# Patient Record
Sex: Female | Born: 1950 | Race: Black or African American | Hispanic: No | Smoking: Former smoker
Health system: Southern US, Community
[De-identification: ages and names within clinical notes are randomized; demographics above are authoritative.]

## PROBLEM LIST (undated history)

## (undated) DIAGNOSIS — E785 Hyperlipidemia, unspecified: Secondary | ICD-10-CM

## (undated) DIAGNOSIS — I10 Essential (primary) hypertension: Secondary | ICD-10-CM

## (undated) HISTORY — DX: Hyperlipidemia, unspecified: E78.5

## (undated) HISTORY — DX: Essential (primary) hypertension: I10

## (undated) HISTORY — PX: TUBAL LIGATION: SHX77

## (undated) HISTORY — PX: ABDOMINAL HYSTERECTOMY: SHX81

---

## 2004-07-22 ENCOUNTER — Ambulatory Visit: Payer: Self-pay

## 2004-07-25 ENCOUNTER — Ambulatory Visit: Payer: Self-pay

## 2004-07-25 DIAGNOSIS — Z860100 Personal history of colon polyps, unspecified: Secondary | ICD-10-CM | POA: Insufficient documentation

## 2005-07-14 ENCOUNTER — Ambulatory Visit: Payer: Self-pay

## 2005-07-29 ENCOUNTER — Ambulatory Visit: Payer: Self-pay

## 2005-11-09 ENCOUNTER — Other Ambulatory Visit: Payer: Self-pay

## 2005-11-09 ENCOUNTER — Emergency Department: Payer: Self-pay | Admitting: Emergency Medicine

## 2006-09-26 ENCOUNTER — Emergency Department (HOSPITAL_COMMUNITY): Admission: EM | Admit: 2006-09-26 | Discharge: 2006-09-26 | Payer: Self-pay | Admitting: Emergency Medicine

## 2006-10-19 ENCOUNTER — Emergency Department (HOSPITAL_COMMUNITY): Admission: EM | Admit: 2006-10-19 | Discharge: 2006-10-19 | Payer: Self-pay | Admitting: Pediatrics

## 2006-10-26 ENCOUNTER — Ambulatory Visit: Payer: Self-pay | Admitting: Internal Medicine

## 2006-10-27 ENCOUNTER — Ambulatory Visit: Payer: Self-pay | Admitting: *Deleted

## 2006-11-26 ENCOUNTER — Ambulatory Visit: Payer: Self-pay | Admitting: Internal Medicine

## 2006-12-10 ENCOUNTER — Ambulatory Visit (HOSPITAL_COMMUNITY): Admission: RE | Admit: 2006-12-10 | Discharge: 2006-12-10 | Payer: Self-pay | Admitting: Internal Medicine

## 2007-05-06 DIAGNOSIS — R42 Dizziness and giddiness: Secondary | ICD-10-CM | POA: Insufficient documentation

## 2007-05-06 DIAGNOSIS — E119 Type 2 diabetes mellitus without complications: Secondary | ICD-10-CM

## 2007-05-06 DIAGNOSIS — I10 Essential (primary) hypertension: Secondary | ICD-10-CM | POA: Insufficient documentation

## 2007-06-09 ENCOUNTER — Ambulatory Visit: Payer: Self-pay | Admitting: Internal Medicine

## 2007-06-09 DIAGNOSIS — E785 Hyperlipidemia, unspecified: Secondary | ICD-10-CM

## 2007-06-09 DIAGNOSIS — M76899 Other specified enthesopathies of unspecified lower limb, excluding foot: Secondary | ICD-10-CM

## 2007-06-09 DIAGNOSIS — M545 Low back pain: Secondary | ICD-10-CM | POA: Insufficient documentation

## 2007-06-16 ENCOUNTER — Ambulatory Visit: Payer: Self-pay | Admitting: Internal Medicine

## 2007-06-23 ENCOUNTER — Encounter (INDEPENDENT_AMBULATORY_CARE_PROVIDER_SITE_OTHER): Payer: Self-pay | Admitting: Internal Medicine

## 2007-06-23 LAB — CONVERTED CEMR LAB
HDL: 37 mg/dL — ABNORMAL LOW (ref >39)
LDL Cholesterol: 77 mg/dL (ref 0–99)
Total CHOL/HDL Ratio: 4.5
Triglycerides: 259 mg/dL — ABNORMAL HIGH (ref <150)
VLDL: 52 mg/dL — ABNORMAL HIGH (ref 0–40)

## 2007-07-29 ENCOUNTER — Emergency Department (HOSPITAL_COMMUNITY): Admission: EM | Admit: 2007-07-29 | Discharge: 2007-07-29 | Payer: Self-pay | Admitting: Emergency Medicine

## 2007-09-08 ENCOUNTER — Ambulatory Visit: Payer: Self-pay | Admitting: Internal Medicine

## 2007-09-08 LAB — CONVERTED CEMR LAB
Blood Glucose, Fingerstick: 173
Hgb A1c MFr Bld: 7.7 %

## 2007-11-07 ENCOUNTER — Ambulatory Visit: Payer: Self-pay | Admitting: Internal Medicine

## 2007-11-08 ENCOUNTER — Encounter (INDEPENDENT_AMBULATORY_CARE_PROVIDER_SITE_OTHER): Payer: Self-pay | Admitting: Internal Medicine

## 2007-11-08 LAB — CONVERTED CEMR LAB
HDL: 38 mg/dL — ABNORMAL LOW (ref >39)
LDL Cholesterol: 43 mg/dL (ref 0–99)
Triglycerides: 389 mg/dL — ABNORMAL HIGH (ref <150)

## 2007-12-13 ENCOUNTER — Ambulatory Visit (HOSPITAL_COMMUNITY): Admission: RE | Admit: 2007-12-13 | Discharge: 2007-12-13 | Payer: Self-pay | Admitting: Family Medicine

## 2007-12-13 ENCOUNTER — Encounter (INDEPENDENT_AMBULATORY_CARE_PROVIDER_SITE_OTHER): Payer: Self-pay | Admitting: Internal Medicine

## 2008-01-03 ENCOUNTER — Telehealth (INDEPENDENT_AMBULATORY_CARE_PROVIDER_SITE_OTHER): Payer: Self-pay | Admitting: Internal Medicine

## 2008-07-26 ENCOUNTER — Ambulatory Visit: Payer: Self-pay | Admitting: Internal Medicine

## 2008-07-26 LAB — CONVERTED CEMR LAB
Nitrite: NEGATIVE
Specific Gravity, Urine: <1.005
Urobilinogen, UA: 0.2

## 2008-07-31 ENCOUNTER — Ambulatory Visit: Payer: Self-pay | Admitting: Internal Medicine

## 2008-08-01 ENCOUNTER — Ambulatory Visit: Payer: Self-pay | Admitting: Internal Medicine

## 2008-08-01 LAB — CONVERTED CEMR LAB
ALT: 13 units/L (ref 0–35)
Albumin: 4.5 g/dL (ref 3.5–5.2)
Alkaline Phosphatase: 75 units/L (ref 39–117)
BUN: 14 mg/dL (ref 6–23)
Basophils Absolute: 0.1 10*3/uL (ref 0.0–0.1)
CO2: 24 meq/L (ref 19–32)
Chloride: 101 meq/L (ref 96–112)
Creatinine, Ser: 0.93 mg/dL (ref 0.40–1.20)
Eosinophils Relative: 2 % (ref 0–5)
Glucose, Bld: 162 mg/dL — ABNORMAL HIGH (ref 70–99)
HCT: 42.6 % (ref 36.0–46.0)
Hemoglobin: 13.3 g/dL (ref 12.0–15.0)
LDL Cholesterol: 95 mg/dL (ref 0–99)
MCHC: 31.2 g/dL (ref 30.0–36.0)
MCV: 95.9 fL (ref 78.0–100.0)
Microalb, Ur: 12.8 mg/dL — ABNORMAL HIGH (ref 0.00–1.89)
Monocytes Absolute: 0.7 10*3/uL (ref 0.1–1.0)
Neutrophils Relative %: 56 % (ref 43–77)
Platelets: 305 10*3/uL (ref 150–400)
Potassium: 4 meq/L (ref 3.5–5.3)
Total CHOL/HDL Ratio: 4.8
Total Protein: 8.1 g/dL (ref 6.0–8.3)
VLDL: 46 mg/dL — ABNORMAL HIGH (ref 0–40)
WBC: 9.3 10*3/uL (ref 4.0–10.5)

## 2008-08-07 ENCOUNTER — Encounter (INDEPENDENT_AMBULATORY_CARE_PROVIDER_SITE_OTHER): Payer: Self-pay | Admitting: Internal Medicine

## 2008-08-12 ENCOUNTER — Ambulatory Visit: Payer: Self-pay | Admitting: Vascular Surgery

## 2008-08-16 ENCOUNTER — Encounter (INDEPENDENT_AMBULATORY_CARE_PROVIDER_SITE_OTHER): Payer: Self-pay | Admitting: Internal Medicine

## 2008-12-18 ENCOUNTER — Encounter (INDEPENDENT_AMBULATORY_CARE_PROVIDER_SITE_OTHER): Payer: Self-pay | Admitting: Internal Medicine

## 2008-12-18 ENCOUNTER — Ambulatory Visit (HOSPITAL_COMMUNITY): Admission: RE | Admit: 2008-12-18 | Discharge: 2008-12-18 | Payer: Self-pay | Admitting: Internal Medicine

## 2009-04-29 ENCOUNTER — Ambulatory Visit: Payer: Self-pay | Admitting: Internal Medicine

## 2009-04-29 ENCOUNTER — Encounter: Payer: Self-pay | Admitting: Physician Assistant

## 2009-04-29 DIAGNOSIS — R209 Unspecified disturbances of skin sensation: Secondary | ICD-10-CM | POA: Insufficient documentation

## 2009-04-29 LAB — CONVERTED CEMR LAB
Albumin: 4.3 g/dL (ref 3.5–5.2)
BUN: 11 mg/dL (ref 6–23)
Basophils Absolute: 0 10*3/uL (ref 0.0–0.1)
Basophils Relative: 0 % (ref 0–1)
CO2: 24 meq/L (ref 19–32)
Calcium: 9.5 mg/dL (ref 8.4–10.5)
Creatinine, Ser: 0.99 mg/dL (ref 0.40–1.20)
Glucose, Bld: 128 mg/dL — ABNORMAL HIGH (ref 70–99)
HCT: 40.5 % (ref 36.0–46.0)
Hemoglobin: 12.7 g/dL (ref 12.0–15.0)
Lymphs Abs: 3.3 10*3/uL (ref 0.7–4.0)
Neutro Abs: 7.3 10*3/uL (ref 1.7–7.7)
Neutrophils Relative %: 64 % (ref 43–77)
Potassium: 4.4 meq/L (ref 3.5–5.3)
RBC: 4.25 M/uL (ref 3.87–5.11)
WBC: 11.4 10*3/uL — ABNORMAL HIGH (ref 4.0–10.5)

## 2009-05-01 ENCOUNTER — Encounter: Payer: Self-pay | Admitting: Physician Assistant

## 2009-05-03 ENCOUNTER — Encounter (INDEPENDENT_AMBULATORY_CARE_PROVIDER_SITE_OTHER): Payer: Self-pay | Admitting: Internal Medicine

## 2009-05-17 ENCOUNTER — Ambulatory Visit: Payer: Self-pay | Admitting: Internal Medicine

## 2009-05-17 DIAGNOSIS — R079 Chest pain, unspecified: Secondary | ICD-10-CM

## 2009-05-20 ENCOUNTER — Ambulatory Visit (HOSPITAL_COMMUNITY): Admission: RE | Admit: 2009-05-20 | Discharge: 2009-05-20 | Payer: Self-pay | Admitting: Internal Medicine

## 2009-06-04 ENCOUNTER — Ambulatory Visit: Payer: Self-pay | Admitting: Internal Medicine

## 2009-06-04 DIAGNOSIS — F329 Major depressive disorder, single episode, unspecified: Secondary | ICD-10-CM

## 2009-06-04 LAB — CONVERTED CEMR LAB: Blood Glucose, Fingerstick: 135

## 2009-06-05 ENCOUNTER — Encounter (INDEPENDENT_AMBULATORY_CARE_PROVIDER_SITE_OTHER): Payer: Self-pay | Admitting: Internal Medicine

## 2009-06-12 ENCOUNTER — Encounter (INDEPENDENT_AMBULATORY_CARE_PROVIDER_SITE_OTHER): Payer: Self-pay | Admitting: Internal Medicine

## 2009-06-12 ENCOUNTER — Ambulatory Visit (HOSPITAL_COMMUNITY): Admission: RE | Admit: 2009-06-12 | Discharge: 2009-06-12 | Payer: Self-pay | Admitting: Internal Medicine

## 2009-08-08 ENCOUNTER — Ambulatory Visit: Payer: Self-pay | Admitting: Internal Medicine

## 2009-08-08 DIAGNOSIS — M25569 Pain in unspecified knee: Secondary | ICD-10-CM

## 2009-08-08 LAB — CONVERTED CEMR LAB: Blood Glucose, Fingerstick: 151

## 2009-09-13 ENCOUNTER — Ambulatory Visit: Payer: Self-pay | Admitting: Internal Medicine

## 2009-09-13 LAB — CONVERTED CEMR LAB
ALT: 10 units/L (ref 0–35)
Albumin: 4.1 g/dL (ref 3.5–5.2)
BUN: 14 mg/dL (ref 6–23)
CO2: 27 meq/L (ref 19–32)
Calcium: 10 mg/dL (ref 8.4–10.5)
Chloride: 101 meq/L (ref 96–112)
Glucose, Bld: 142 mg/dL — ABNORMAL HIGH (ref 70–99)
Sodium: 139 meq/L (ref 135–145)
Total Bilirubin: 0.7 mg/dL (ref 0.3–1.2)
Total CHOL/HDL Ratio: 3.4
Total Protein: 7.4 g/dL (ref 6.0–8.3)
Triglycerides: 124 mg/dL (ref <150)
VLDL: 25 mg/dL (ref 0–40)

## 2009-09-17 ENCOUNTER — Ambulatory Visit: Payer: Self-pay | Admitting: Internal Medicine

## 2009-09-17 DIAGNOSIS — Z780 Asymptomatic menopausal state: Secondary | ICD-10-CM | POA: Insufficient documentation

## 2009-09-17 LAB — CONVERTED CEMR LAB
Bilirubin Urine: NEGATIVE
Blood Glucose, Fingerstick: 126
Glucose, Urine, Semiquant: NEGATIVE
Ketones, urine, test strip: NEGATIVE
Nitrite: NEGATIVE
OCCULT 1: NEGATIVE
OCCULT 2: NEGATIVE
OCCULT 3: NEGATIVE
Protein, U semiquant: NEGATIVE
Specific Gravity, Urine: 1.01
Urobilinogen, UA: 0.2
WBC Urine, dipstick: NEGATIVE
pH: 5

## 2009-09-24 ENCOUNTER — Encounter (INDEPENDENT_AMBULATORY_CARE_PROVIDER_SITE_OTHER): Payer: Self-pay | Admitting: Internal Medicine

## 2009-10-08 ENCOUNTER — Encounter (INDEPENDENT_AMBULATORY_CARE_PROVIDER_SITE_OTHER): Payer: Self-pay | Admitting: Internal Medicine

## 2009-12-19 ENCOUNTER — Ambulatory Visit (HOSPITAL_COMMUNITY): Admission: RE | Admit: 2009-12-19 | Discharge: 2009-12-19 | Payer: Self-pay | Admitting: Internal Medicine

## 2009-12-19 ENCOUNTER — Encounter (INDEPENDENT_AMBULATORY_CARE_PROVIDER_SITE_OTHER): Payer: Self-pay | Admitting: Internal Medicine

## 2009-12-24 ENCOUNTER — Encounter: Admission: RE | Admit: 2009-12-24 | Discharge: 2009-12-24 | Payer: Self-pay | Admitting: Internal Medicine

## 2009-12-25 ENCOUNTER — Encounter (INDEPENDENT_AMBULATORY_CARE_PROVIDER_SITE_OTHER): Payer: Self-pay | Admitting: Internal Medicine

## 2010-01-04 ENCOUNTER — Encounter (INDEPENDENT_AMBULATORY_CARE_PROVIDER_SITE_OTHER): Payer: Self-pay | Admitting: Internal Medicine

## 2010-01-04 DIAGNOSIS — M899 Disorder of bone, unspecified: Secondary | ICD-10-CM | POA: Insufficient documentation

## 2010-01-04 DIAGNOSIS — M949 Disorder of cartilage, unspecified: Secondary | ICD-10-CM

## 2010-01-16 ENCOUNTER — Ambulatory Visit: Payer: Self-pay | Admitting: Internal Medicine

## 2010-01-16 DIAGNOSIS — M109 Gout, unspecified: Secondary | ICD-10-CM | POA: Insufficient documentation

## 2010-01-16 LAB — CONVERTED CEMR LAB: Blood Glucose, Fingerstick: 242

## 2010-01-28 ENCOUNTER — Encounter (INDEPENDENT_AMBULATORY_CARE_PROVIDER_SITE_OTHER): Payer: Self-pay | Admitting: Internal Medicine

## 2010-02-15 ENCOUNTER — Telehealth (INDEPENDENT_AMBULATORY_CARE_PROVIDER_SITE_OTHER): Payer: Self-pay | Admitting: *Deleted

## 2010-02-15 ENCOUNTER — Encounter (INDEPENDENT_AMBULATORY_CARE_PROVIDER_SITE_OTHER): Payer: Self-pay | Admitting: Internal Medicine

## 2010-02-24 ENCOUNTER — Ambulatory Visit: Payer: Self-pay | Admitting: Internal Medicine

## 2010-02-24 LAB — CONVERTED CEMR LAB
Calcium: 10.5 mg/dL (ref 8.4–10.5)
Chloride: 104 meq/L (ref 96–112)
Creatinine, Ser: 1.02 mg/dL (ref 0.40–1.20)
Potassium: 4.1 meq/L (ref 3.5–5.3)

## 2010-03-06 ENCOUNTER — Ambulatory Visit: Payer: Self-pay

## 2010-03-06 ENCOUNTER — Encounter (INDEPENDENT_AMBULATORY_CARE_PROVIDER_SITE_OTHER): Payer: Self-pay | Admitting: Internal Medicine

## 2010-03-06 DIAGNOSIS — K219 Gastro-esophageal reflux disease without esophagitis: Secondary | ICD-10-CM

## 2010-03-08 ENCOUNTER — Encounter (INDEPENDENT_AMBULATORY_CARE_PROVIDER_SITE_OTHER): Payer: Self-pay | Admitting: Internal Medicine

## 2010-04-18 ENCOUNTER — Ambulatory Visit: Payer: Self-pay | Admitting: Internal Medicine

## 2010-04-18 LAB — CONVERTED CEMR LAB
Microalb Creat Ratio: 20.3 mg/g (ref 0.0–30.0)
Microalb, Ur: 2.79 mg/dL — ABNORMAL HIGH (ref 0.00–1.89)

## 2010-05-07 ENCOUNTER — Encounter (INDEPENDENT_AMBULATORY_CARE_PROVIDER_SITE_OTHER): Payer: Self-pay | Admitting: Internal Medicine

## 2010-07-22 ENCOUNTER — Encounter
Admission: RE | Admit: 2010-07-22 | Discharge: 2010-07-22 | Payer: Self-pay | Source: Home / Self Care | Attending: Internal Medicine | Admitting: Internal Medicine

## 2010-09-07 LAB — CONVERTED CEMR LAB
Bilirubin Urine: NEGATIVE
HDL: 41 mg/dL (ref >39)
Hgb A1c MFr Bld: 6.6 %
Specific Gravity, Urine: <1.005
Triglycerides: 189 mg/dL — ABNORMAL HIGH (ref <150)
Urobilinogen, UA: 0.2
WBC Urine, dipstick: NEGATIVE
pH: 5

## 2010-09-11 NOTE — Assessment & Plan Note (Signed)
Summary: FOLLOW UP WITH DR Xian Apostol IN 4 MONTHS, Vit D level/ DM//GK   Vital Signs:  Patient profile:   60 year old female Menstrual status:  hysterectomy Weight:      203 pounds Temp:     98.1 degrees F Pulse rate:   82 / minute Pulse rhythm:   regular Resp:     18 per minute BP sitting:   115 / 79  (left arm) Cuff size:   large  Vitals Entered By: Vesta Mixer CMA (January 16, 2010 10:25 AM) CC: 4 month f/u dm and Vit D level Is Patient Diabetic? Yes Pain Assessment Patient in pain? no      CBG Result 242  Does patient need assistance? Ambulation Normal   CC:  4 month f/u dm and Vit D level.  History of Present Illness: 1.  DM:  not exercising as before.  Was walking more. Eating more and not eating the right things per pt.    2.  Osteopenia:  Pt. taking calcium 500 mg two times a day--not clear if any Vitamin D.   3.  Swelling of left great MTP.  Blamed her calcium tablets on this.  Swelling went down.  Was eating fish at the time--not sure what kind.  On Triamteren HCTZ for bp.  No on an ACE or ARB  Current Medications (verified): 1)  Metformin Hcl 500 Mg  Tabs (Metformin Hcl) .Marland Kitchen.. 1 Tab By Mouth Two Times A Day With Meals 2)  Triamterene-Hctz 37.5-25 Mg  Tabs (Triamterene-Hctz) .Marland Kitchen.. 1 Tab By Mouth Daily 3)  Meclizine Hcl 25 Mg  Tabs (Meclizine Hcl) .Marland Kitchen.. 1 Tab By Mouth Q6h As Needed Vertigo 4)  Adult Aspirin Ec Low Strength 81 Mg  Tbec (Aspirin) .Marland Kitchen.. 1 Tab By Mouth Daily 5)  Glucometer Elite Classic   Kit (Blood Glucose Monitoring Suppl) 6)  Glucometer Elite Test   Strp (Glucose Blood) .... Test Once Daily 7)  Lovaza 1 Gm  Caps (Omega-3-Acid Ethyl Esters) .... 4 Caps By Mouth Daily 8)  Pravastatin Sodium 40 Mg Tabs (Pravastatin Sodium) .Marland Kitchen.. 1 Tab By Mouth Daily  Allergies (verified): No Known Drug Allergies  Physical Exam  General:  NAD Lungs:  Normal respiratory effort, chest expands symmetrically. Lungs are clear to auscultation, no crackles or  wheezes. Heart:  Normal rate and regular rhythm. S1 and S2 normal without gallop, murmur, click, rub or other extra sounds. Extremities:  Left great MTP mildly erythematous--nontender currently and without swelling.   Impression & Recommendations:  Problem # 1:  GOUT (ICD-274.9) Switch from Triamteren/HCTZ to Losartan--may help with decrease of uric acid Orders: T-Uric Acid (Blood) ([id number])  Problem # 2:  OSTEOPENIA (ICD-733.90) Check Vitamin D level To take calcium 500 mg three times a day  Orders: T- * Misc. Laboratory test [phone])  Complete Medication List: 1)  Metformin Hcl 500 Mg Tabs (Metformin hcl) .Marland Kitchen.. 1 tab by mouth two times a day with meals 2)  Losartan Potassium 100 Mg Tabs (Losartan potassium) .Marland Kitchen.. 1 tab by mouth daily 3)  Meclizine Hcl 25 Mg Tabs (Meclizine hcl) .Marland Kitchen.. 1 tab by mouth q6h as needed vertigo 4)  Adult Aspirin Ec Low Strength 81 Mg Tbec (Aspirin) .Marland Kitchen.. 1 tab by mouth daily 5)  Glucometer Elite Classic Kit (Blood glucose monitoring suppl) 6)  Glucometer Elite Test Strp (Glucose blood) .... Test once daily 7)  Lovaza 1 Gm Caps (Omega-3-acid ethyl esters) .... 4 caps by mouth daily 8)  Pravastatin Sodium 40 Mg  Tabs (Pravastatin sodium) .Marland Kitchen.. 1 tab by mouth daily  Other Orders: Capillary Blood Glucose/CBG ([id number])  Patient Instructions: 1)  Take calcium 500 mg with Vitamin D three times a day  2)  Be physically active. 3)  Watch your diet. 4)  BMET and BP check in 2 weeks.  Nurse visit. 5)  Follow up with Dr. Delrae Alfred in 3 months for gout, DM Prescriptions: LOSARTAN POTASSIUM 100 MG TABS (LOSARTAN POTASSIUM) 1 tab by mouth daily  #30 x 11   Entered and Authorized by:   Julieanne Manson MD   Signed by:   Julieanne Manson MD on 01/16/2010   Method used:   Faxed to ...       [facility] (retail)       [address] [place], [place]  [address]       Ph: [phone]       Fax: [phone]   RxID:    [id number]   Laboratory Results   Blood Tests     HGBA1C: 7.5%   (Normal Range: Non-Diabetic - 3-6%   Control Diabetic - 6-8%) CBG Random:: 242mg /dL

## 2010-09-11 NOTE — Assessment & Plan Note (Signed)
Summary: 3 MONTH FU ON BP////KT   Vital Signs:  Patient profile:   60 year old female Menstrual status:  hysterectomy Height:      65 inches Weight:      205.2 pounds BMI:     34.27 Temp:     97.7 degrees F oral Pulse rate:   72 / minute Pulse rhythm:   regular Resp:     16 per minute BP sitting:   130 / 88  (left arm) Cuff size:   large  Vitals Entered By: Michelle Nasuti (April 18, 2010 11:42 AM) CC: dm and htn follow up Is Patient Diabetic? Yes Pain Assessment Patient in pain? no      CBG Result 122 CBG Device ID [id number]  Does patient need assistance? Functional Status Self care Ambulation Normal   CC:  dm and htn follow up.  History of Present Illness: 1.  Gout:  has not had a recurrence with change of medication to Losartan from HCTZ.    2.  Hypertension:  Is walking, did not like swimming.  Does have an exercise bike she can use.    3.  DM:  Intermittently checking sugars--did not bring her book today.  80-130 range fasting.  Sugars generally in low 100s later in day.  A1C was 7.5% in June.    4.  Adenomatous colon polyps:  not clear if had colonoscopy or CT done for evaluation.  Did have upper GI.  Will have to check to see if records in.  Habits & Providers  Alcohol-Tobacco-Diet     Tobacco Status: quit  Current Medications (verified): 1)  Metformin Hcl 500 Mg  Tabs (Metformin Hcl) .Marland Kitchen.. 1 Tab By Mouth Two Times A Day With Meals 2)  Losartan Potassium 100 Mg Tabs (Losartan Potassium) .Marland Kitchen.. 1 Tab By Mouth Daily 3)  Meclizine Hcl 25 Mg  Tabs (Meclizine Hcl) .Marland Kitchen.. 1 Tab By Mouth Q6h As Needed Vertigo 4)  Adult Aspirin Ec Low Strength 81 Mg  Tbec (Aspirin) .Marland Kitchen.. 1 Tab By Mouth Daily 5)  Glucometer Elite Classic   Kit (Blood Glucose Monitoring Suppl) 6)  Glucometer Elite Test   Strp (Glucose Blood) .... Test Once Daily 7)  Lovaza 1 Gm  Caps (Omega-3-Acid Ethyl Esters) .... 4 Caps By Mouth Daily 8)  Pravastatin Sodium 40 Mg Tabs (Pravastatin Sodium) .Marland Kitchen.. 1 Tab  By Mouth Daily  Allergies (verified): No Known Drug Allergies  Physical Exam  Lungs:  Normal respiratory effort, chest expands symmetrically. Lungs are clear to auscultation, no crackles or wheezes. Heart:  Normal rate and regular rhythm. S1 and S2 normal without gallop, murmur, click, rub or other extra sounds.  Radial pulses normal and equal Abdomen:  Bowel sounds positive,abdomen soft and non-tender without masses, organomegaly or hernias noted.   Impression & Recommendations:  Problem # 1:  COLONIC POLYPS, ADENOMATOUS, HX OF (ICD-V12.72) [facility] on records from [facility].  Problem # 2:  GOUT (ICD-274.9) No further attacks since off hctz Orders: T-Uric Acid (Blood) ([id number])  Problem # 3:  HYPERTENSION (ICD-401.9) Controlled with change in meds to avoid gout. Her updated medication list for this problem includes:    Losartan Potassium 100 Mg Tabs (Losartan potassium) .Marland Kitchen... 1 tab by mouth daily  Problem # 4:  DM (ICD-250.00)  Her updated medication list for this problem includes:    Metformin Hcl 500 Mg Tabs (Metformin hcl) .Marland Kitchen... 1 tab by mouth two times a day with meals    Losartan Potassium 100  Mg Tabs (Losartan potassium) .Marland Kitchen... 1 tab by mouth daily    Adult Aspirin Ec Low Strength 81 Mg Tbec (Aspirin) .Marland Kitchen... 1 tab by mouth daily  Orders: T-Urine Microalbumin w/creat. ratio [phone])  Complete Medication List: 1)  Metformin Hcl 500 Mg Tabs (Metformin hcl) .Marland Kitchen.. 1 tab by mouth two times a day with meals 2)  Losartan Potassium 100 Mg Tabs (Losartan potassium) .Marland Kitchen.. 1 tab by mouth daily 3)  Meclizine Hcl 25 Mg Tabs (Meclizine hcl) .Marland Kitchen.. 1 tab by mouth q6h as needed vertigo 4)  Adult Aspirin Ec Low Strength 81 Mg Tbec (Aspirin) .Marland Kitchen.. 1 tab by mouth daily 5)  Glucometer Elite Classic Kit (Blood glucose monitoring suppl) 6)  Glucometer Elite Test Strp (Glucose blood) .... Test once daily 7)  Lovaza 1 Gm Caps (Omega-3-acid ethyl esters) .... 4 caps by mouth  daily 8)  Pravastatin Sodium 40 Mg Tabs (Pravastatin sodium) .Marland Kitchen.. 1 tab by mouth daily  Other Orders: Capillary Blood Glucose/CBG ([address]147)  Patient Instructions: 1)  Follow up for CPP in February

## 2010-09-11 NOTE — Letter (Signed)
Summary: *HSN Results Follow up  [facility]  [address] [place], [place] [address]   Phone: [phone]  Fax: [phone]      01/04/2010   [facility] [address] [place], [place]  [address]   Dear  Ms. Yvette Crane,                            ____S.Drinkard,FNP   ____D. Gore,FNP       ____B. McPherson,MD   ____V. Rankins,MD    __X__E. Shivan Hodes,MD    ____N. Daphine Deutscher, FNP  ____D. Reche Dixon, MD    ____K. Philipp Deputy, MD    ____Other     This letter is to inform you that your recent test(s):  _______Pap Smear    _______Lab Test     ____X___X-ray    _______ is within acceptable limits  _______ requires a medication change  ____X___ requires a follow-up lab visit  _______ requires a follow-up visit with your provider   Comments:  Your bone density is a bit decreased in your spine--your hips at this point are okay.  Tiffany should have already called to have you come in for a Vitamin D level.  Recommend until we get that back that you take Calcium 600 mg two times a day with 800 International Units of vitamin D daily     _________________________________________________________ If you have any questions, please contact our office                     Sincerely,  Julieanne Manson MD [facility]

## 2010-09-11 NOTE — Letter (Signed)
Summary: [place] [facility]ION[facility]ORDERS  [place] [facility]/ORDERS   Imported By: Arta Bruce 02/24/2010 12:49:40  _____________________________________________________________________  External Attachment:    Type:   Image     Comment:   External Document

## 2010-09-11 NOTE — Letter (Signed)
Summary: *HSN Results Follow up  [facility]  [address] [place], [place] [address]   Phone: [phone]  Fax: [phone]      01/28/2010   [facility] [address] [place], [place]  [address]   Dear  Ms. Shaleena Yott,                            ____S.Drinkard,FNP   ____D. Gore,FNP       ____B. McPherson,MD   ____V. Rankins,MD    ___X_E. Jameil Whitmoyer,MD    ____N. Daphine Deutscher, FNP  ____D. Reche Dixon, MD    ____K. Philipp Deputy, MD    ____Other     This letter is to inform you that your recent test(s):  _______Pap Smear    ___X____Lab Test     _______X-ray    _______ is within acceptable limits  ___X____ requires a medication change  _______ requires a follow-up lab visit  _______ requires a follow-up visit with your provider   Comments:  Your uric acid level was high, supporting the diagnosis of gout for your toe/foot swelling before.  Hopefully the change in your medication will help.   Your Vitamin D level was minimally low.  Recommend taking 800 International Units  of Vitamin D daily along with your calcium for that.        _________________________________________________________ If you have any questions, please contact our office                     Sincerely,  Julieanne Manson MD [facility]

## 2010-09-11 NOTE — Letter (Signed)
Summary: *HSN Results Follow up  [facility]  [address] [place], [place] [address]   Phone: [phone]  Fax: [phone]      03/08/2010   [facility] [address] [place], [place]  [address]   Dear  Ms. Moyinoluwa Klecker,                            ____S.Drinkard,FNP   ____D. Gore,FNP       ____B. McPherson,MD   ____V. Rankins,MD    __X__E. Wilson Dusenbery,MD    ____N. Daphine Deutscher, FNP  ____D. Reche Dixon, MD    ____K. Philipp Deputy, MD    ____Other     This letter is to inform you that your recent test(s):  _______Pap Smear    ___X____Lab Test     _______X-ray    ____X___ is within acceptable limits  _______ requires a medication change  _______ requires a follow-up lab visit  _______ requires a follow-up visit with your provider   Comments:  Keep appt. to recheck bp       _________________________________________________________ If you have any questions, please contact our office                     Sincerely,  Julieanne Manson MD [facility]

## 2010-09-11 NOTE — Miscellaneous (Signed)
Summary: old record review--colonoscopy 2005  Clinical Lists Changes  Problems: Added new problem of COLONIC POLYPS, ADENOMATOUS, HX OF (ICD-V12.72) Observations: Added new observation of COLONOSCOPY: Dr. Lyman Bishop [place]:  for rectal bleeding:  Internal hemorrhoids, transverse and sigmoid polyps.  Transverse polyp a tubular adenoma, sigmoid was hyperplastic (07/25/2004 15:28)

## 2010-09-11 NOTE — Progress Notes (Signed)
  Phone Note Outgoing Call   Call placed by: Julieanne Manson MD,  February 15, 2010 3:33 PM Summary of Call: [facility]:  needs referral for colonoscopy--follow up of adenomatous polyp in 2005 Tiffany--please call pt. and notify her that I am sending her onto GI before forwarding this to [place]. Initial call taken by: Julieanne Manson MD,  February 15, 2010 3:33 PM  Follow-up for Phone Call        Pt aware that she will be getting a call from [place]. Follow-up by: Vesta Mixer CMA,  February 19, 2010 12:16 PM

## 2010-09-11 NOTE — Letter (Signed)
Summary: *HSN Results Follow up  [facility]  [address] [place], [place] [address]   Phone: [phone]  Fax: [phone]      05/07/2010   [facility] [place] [address] [place], [place]  [address]   Dear  Ms. Yvette Crane,                            ____S.Drinkard,FNP   ____D. Gore,FNP       ____B. McPherson,MD   ____V. Rankins,MD    __X__E. Mulberry,MD    ____N. Daphine Deutscher, FNP  ____D. Reche Dixon, MD    ____K. Philipp Deputy, MD    ____Other     This letter is to inform you that your recent test(s):  _______Pap Smear    ___X____Lab Test     _______X-ray    ___X____ is within acceptable limits  _______ requires a medication change  _______ requires a follow-up lab visit  _______ requires a follow-up visit with your [name]   Comments:  Your uric acid level is now in the normal range with discontinuation of hydrochlorothiazide.  Hopefully, you will not have a gout flare in the future.       _________________________________________________________ If you have any questions, please contact our office                     Sincerely,  Julieanne Manson MD [facility]

## 2010-09-11 NOTE — Letter (Signed)
Summary: RECORDS RECEIVED FROM [facility] REGIONAL   RECORDS RECEIVED FROM [facility] REGIONAL   Imported By: Arta Bruce 02/27/2010 15:46:45  _____________________________________________________________________  External Attachment:    Type:   Image     Comment:   External Document

## 2010-09-11 NOTE — Letter (Signed)
Summary: REQUESTING RECORDS FROM [facility] REGIONAL  REQUESTING RECORDS FROM [facility] REGIONAL   Imported By: Arta Bruce 10/18/2009 11:16:19  _____________________________________________________________________  External Attachment:    Type:   Image     Comment:   External Document
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
## 2010-09-11 NOTE — Miscellaneous (Signed)
Summary: Needs Vit D Level  Clinical Lists Changes  Problems: Added new problem of OSTEOPENIA (ICD-733.90) - DEXA 12/19/09 - Signed Observations: Added new observation of BONE DENSITY: AP spine T score:  -1.8;  Femur, left neck:  T score:  -0.9;  Femur, right neck  T score:  -0.7; 10 year FRAX probablility of 0.2% for hip fx or 3% for major osteoporotic fracture (12/18/2009 22:52)  Please call pt. and have her come in for a 25 OH vitamin D level as her bone density is a bit low--not osteoporotic, however.  Pt. given info has appt. with Dr. Delrae Alfred 01/16/10 will have Vit D level drawn then Gaylyn Cheers RN  Jan 07, 2010 1:49 PM

## 2010-09-22 ENCOUNTER — Encounter (INDEPENDENT_AMBULATORY_CARE_PROVIDER_SITE_OTHER): Payer: Self-pay | Admitting: Internal Medicine

## 2010-10-01 NOTE — Assessment & Plan Note (Signed)
Summary: Lipids, TSH, CBC  Nurse Visit   Allergies: No Known Drug Allergies  Orders Added: 1)  Est. Patient Level I [47829] 2)  T-Lipid Profile [80061-22930] 3)  T-CBC w/Diff [56213-08657] 4)  T-TSH [84696-29528]

## 2010-10-04 DIAGNOSIS — D649 Anemia, unspecified: Secondary | ICD-10-CM | POA: Insufficient documentation

## 2010-10-04 LAB — CONVERTED CEMR LAB
Basophils Absolute: 0.1 10*3/uL (ref 0.0–0.1)
Basophils Relative: 1 % (ref 0–1)
Hemoglobin: 11.8 g/dL — ABNORMAL LOW (ref 12.0–15.0)
LDL Cholesterol: 50 mg/dL (ref 0–99)
Lymphocytes Relative: 37 % (ref 12–46)
Lymphs Abs: 4 10*3/uL (ref 0.7–4.0)
MCHC: 31.3 g/dL (ref 30.0–36.0)
Platelets: 296 10*3/uL (ref 150–400)
RDW: 12.6 % (ref 11.5–15.5)
Total CHOL/HDL Ratio: 2.9
Triglycerides: 94 mg/dL (ref <150)
VLDL: 19 mg/dL (ref 0–40)

## 2010-10-13 ENCOUNTER — Encounter (INDEPENDENT_AMBULATORY_CARE_PROVIDER_SITE_OTHER): Payer: Self-pay | Admitting: Internal Medicine

## 2010-10-14 ENCOUNTER — Encounter (INDEPENDENT_AMBULATORY_CARE_PROVIDER_SITE_OTHER): Payer: Self-pay | Admitting: Internal Medicine

## 2010-10-15 ENCOUNTER — Encounter (INDEPENDENT_AMBULATORY_CARE_PROVIDER_SITE_OTHER): Payer: Self-pay | Admitting: Internal Medicine

## 2010-10-16 ENCOUNTER — Encounter (INDEPENDENT_AMBULATORY_CARE_PROVIDER_SITE_OTHER): Payer: Self-pay | Admitting: Internal Medicine

## 2010-10-16 ENCOUNTER — Telehealth (INDEPENDENT_AMBULATORY_CARE_PROVIDER_SITE_OTHER): Payer: Self-pay | Admitting: Internal Medicine

## 2010-10-21 NOTE — Letter (Signed)
Summary: [facility] //REFLUX,ABDOMINAL DISCONFORT  [facility] //REFLUX,ABDOMINAL DISCONFORT   Imported By: Arta Bruce 10/13/2010 15:25:47  _____________________________________________________________________  External Attachment:    Type:   Image     Comment:   External Document

## 2010-10-21 NOTE — Letter (Signed)
Summary: REQUESTING INFO FROM [facility]  REQUESTING INFO FROM [facility]   Imported By: Arta Bruce 10/16/2010 10:59:39  _____________________________________________________________________  External Attachment:    Type:   Image     Comment:   External Document

## 2010-10-21 NOTE — Letter (Signed)
Summary: [facility] /ENDOSCOPY [facility]  [facility]   Imported By: Arta Bruce 10/17/2010 10:31:45  _____________________________________________________________________  External Attachment:    Type:   Image     Comment:   External Document

## 2010-10-21 NOTE — Letter (Signed)
Summary: *HSN Results Follow up  [facility]  [address] [place], [place] [address]   Phone: [phone]  Fax: [phone]      10/16/2010   [facility] [place] [address] [place], [place]  [address]   Dear  Ms. ELAINE Dattilio,                            ____S.Drinkard,FNP   ____D. Gore,FNP       ____B. McPherson,MD   ____V. Rankins,MD    _X___E. Lamon Rotundo,MD    ____N. Daphine Deutscher, FNP  ____D. Reche Dixon, MD    ____K. Philipp Deputy, MD    ____Other     This letter is to inform you that your recent test(s):  _______Pap Smear    ____X___Lab Test     _______X-ray    _______ is within acceptable limits  _______ requires a medication change  _______ requires a follow-up lab visit  _______ requires a follow-up visit with your provider   Comments:  Your labs tests do not show a definite cause of your mild anemia.  I am still trying to find out what you had done with Dr. Allena Katz at [facility] last year--you should have had an evaluation of you colon, but I have been unable to get any info other than the evaluation of your stomach with EGD.  If you did not have a colonoscopy or a virtual colonoscopy, then you need one.  Will get  back to you shortly--if you do not hear from [place] in next couple of months, please call regarding this issue.       _________________________________________________________ If you have any questions, please contact our office                     Sincerely,  Julieanne Manson MD [facility]

## 2010-10-22 ENCOUNTER — Telehealth (INDEPENDENT_AMBULATORY_CARE_PROVIDER_SITE_OTHER): Payer: Self-pay | Admitting: Internal Medicine

## 2010-10-28 NOTE — Assessment & Plan Note (Signed)
Summary: Iron, Vitamin B12, RBC folate, give Guaiac Packet, Complete ROI  Nurse Visit   Allergies: No Known Drug Allergies  Orders Added: 1)  Est. Patient Level I [[address]] 2)  T-Folic Acid; RBC [82747-23360] 3)  T-Iron Binding Capacity (TIBC) [[address]] 4)  T-Vitamin B12 [82607-23330]

## 2010-10-28 NOTE — Progress Notes (Signed)
Summary: follow up on previous GI work up  Phone Note [employer] of Call: Nora--can you call Dr. Luan Pulling Patel's office in --You might have to call [facility] to get his office number.  I need to know if Ms. Delmonaco did or did not have a colonoscopy or Virtual colonoscopy/CT in 2011 when she had her EGD.  The colonoscopy report we obtained was from 2005 and showed adenomatous polyps, for which she should have had one of the above in follow up last year. If she did not get some sort of colon evaluatin last year, we will need to set her up for that. Initial call taken by: Julieanne Manson MD,  October 16, 2010 11:31 AM  Follow-up for Phone Call        I call to [facility] they  keep transfer me to a different depts with no answer so I found a referral that [facility] did 02/2010 for a Colonoscopy and also some reports from 03-06-10 that I will show you tomorrow in the clinic and also I lvm to [phone] does where Debra schedule the appt .Marland KitchenCheryll Dessert  October 20, 2010 2:23 PM Aurther Loft from [facility] call me back and she saw that the last procedure was an Upper Gi 02/2010  does the one that Im going to show u and no more appts .Marland KitchenCheryll Dessert  October 20, 2010 2:45 PM

## 2010-10-31 ENCOUNTER — Encounter (INDEPENDENT_AMBULATORY_CARE_PROVIDER_SITE_OTHER): Payer: Self-pay | Admitting: Nurse Practitioner

## 2010-11-06 NOTE — Progress Notes (Signed)
  Phone Note Other Incoming   Caller: Angie  Summary of Call: Dr. Delrae Alfred... Angie wants to know if you want [place] to repeat the RBC folate... [facility] lab called and said we ran the test wrong... We were to pour the the test into a clear tube and freeze it, not spin it down and then freeze it.... Hale Drone CMA  October 22, 2010 4:57 PM   Follow-up for Phone Call        yes--repeat with no charge to pt. Follow-up by: Julieanne Manson MD,  October 24, 2010 10:08 AM  Additional Follow-up for Phone Call Additional follow up Details #1::        pt is aware and will come when she gets a ride Additional Follow-up by: Arm[place] Shannon,  October 28, 2010 11:32 AM

## 2010-11-06 NOTE — Letter (Signed)
Summary: *HSN Results Follow up  [facility]  [address] [place], [place] [address]   Phone: [phone]  Fax: [phone]      10/31/2010   [facility] [place] [address] [place], [place]  [address]   Dear  Ms. ELAINE Shetley,                            ____S.Drinkard,FNP   ____D. Gore,FNP       ____B. McPherson,MD   ____V. Rankins,MD    ____E. Mulberry,MD    __X__N. Daphine Deutscher, FNP  ____D. Reche Dixon, MD    ____K. Philipp Deputy, MD    ____Other     This letter is to inform you that your recent test(s):  Stool Cards    ___X____ is within acceptable limits  _______ requires a medication change  _______ requires a follow-up lab visit  _______ requires a follow-up visit with your provider   Comments: Stool cards are normal. Thanks for returning them to this office.       _________________________________________________________ If you have any questions, please contact our office                     Sincerely,    Lehman Prom FNP [facility]

## 2010-11-06 NOTE — Assessment & Plan Note (Signed)
Summary: Occult card results   Allergies: No Known Drug Allergies   Complete Medication List: 1)  Metformin Hcl 500 Mg Tabs (Metformin hcl) .Marland Kitchen.. 1 tab by mouth two times a day with meals 2)  Losartan Potassium 100 Mg Tabs (Losartan potassium) .Marland Kitchen.. 1 tab by mouth daily 3)  Meclizine Hcl 25 Mg Tabs (Meclizine hcl) .Marland Kitchen.. 1 tab by mouth q6h as needed vertigo 4)  Adult Aspirin Ec Low Strength 81 Mg Tbec (Aspirin) .Marland Kitchen.. 1 tab by mouth daily 5)  Glucometer Elite Classic Kit (Blood glucose monitoring suppl) 6)  Glucometer Elite Test Strp (Glucose blood) .... Test once daily 7)  Lovaza 1 Gm Caps (Omega-3-acid ethyl esters) .... 4 caps by mouth daily 8)  Pravastatin Sodium 40 Mg Tabs (Pravastatin sodium) .Marland Kitchen.. 1 tab by mouth daily     Laboratory Results  Date/Time Received: October 31, 2010 3:49 PM   Stool - Occult Blood Hemmoccult #1: negative Date: 10/31/2010 Hemoccult #2: negative Date: 10/31/2010 Hemoccult #3: negative Date: 10/31/2010  Kit Test Internal QC: Positive   (Normal Range: Negative)

## 2010-12-05 ENCOUNTER — Other Ambulatory Visit (HOSPITAL_COMMUNITY): Payer: Self-pay | Admitting: Internal Medicine

## 2010-12-05 DIAGNOSIS — Z1231 Encounter for screening mammogram for malignant neoplasm of breast: Secondary | ICD-10-CM

## 2010-12-19 ENCOUNTER — Ambulatory Visit (AMBULATORY_SURGERY_CENTER): Payer: Self-pay | Admitting: *Deleted

## 2010-12-19 ENCOUNTER — Telehealth: Payer: Self-pay | Admitting: *Deleted

## 2010-12-19 VITALS — Ht 67.0 in | Wt 216.5 lb

## 2010-12-19 DIAGNOSIS — Z1211 Encounter for screening for malignant neoplasm of colon: Secondary | ICD-10-CM

## 2010-12-19 NOTE — Telephone Encounter (Signed)
Yvette Crane RECORDS RCVD TODAY

## 2010-12-19 NOTE — Telephone Encounter (Signed)
Pt had colonoscopy at [facility] 10 years ago. Does not remember MD's name.  Release of information form signed and given to [name].  Colonoscopy scheduled for 5/09/14/2010 Yvette Crane

## 2010-12-19 NOTE — Progress Notes (Signed)
Pt had colonoscopy 10 years ago at [facility]  Pt signed Release of Information form.  Given to [name], CMA.  Pt gets rx filled at [facility].  Rx for Col[place] called in to pharmacy.  Ezra Sites

## 2010-12-23 ENCOUNTER — Ambulatory Visit (HOSPITAL_COMMUNITY)
Admission: RE | Admit: 2010-12-23 | Discharge: 2010-12-23 | Disposition: A | Discharge status: Home / Self Care | Payer: Self-pay | Source: Ambulatory Visit | Attending: Internal Medicine | Admitting: Internal Medicine

## 2010-12-23 DIAGNOSIS — Z1231 Encounter for screening mammogram for malignant neoplasm of breast: Secondary | ICD-10-CM | POA: Insufficient documentation

## 2011-01-02 ENCOUNTER — Ambulatory Visit (AMBULATORY_SURGERY_CENTER): Payer: Self-pay | Admitting: Gastroenterology

## 2011-01-02 ENCOUNTER — Encounter: Payer: Self-pay | Admitting: Gastroenterology

## 2011-01-02 VITALS — HR 74 | Temp 97.2°F | Resp 22 | Ht 67.0 in | Wt 218.0 lb

## 2011-01-02 DIAGNOSIS — D126 Benign neoplasm of colon, unspecified: Secondary | ICD-10-CM

## 2011-01-02 DIAGNOSIS — Z1211 Encounter for screening for malignant neoplasm of colon: Secondary | ICD-10-CM

## 2011-01-02 LAB — GLUCOSE, CAPILLARY: Glucose-Capillary: 140 mg/dL — ABNORMAL HIGH (ref 70–99)

## 2011-01-02 MED ORDER — SODIUM CHLORIDE 0.9 % IV SOLN: 500.0000 mL | INTRAVENOUS | Status: DC

## 2011-01-02 NOTE — Patient Instructions (Signed)
Please review discharge instructions Please read information about polyps

## 2011-01-06 ENCOUNTER — Telehealth: Payer: Self-pay | Admitting: *Deleted

## 2011-01-06 NOTE — Telephone Encounter (Signed)

## 2011-11-16 ENCOUNTER — Other Ambulatory Visit (HOSPITAL_COMMUNITY): Payer: Self-pay | Admitting: Internal Medicine

## 2011-11-16 DIAGNOSIS — Z1231 Encounter for screening mammogram for malignant neoplasm of breast: Secondary | ICD-10-CM

## 2011-12-30 ENCOUNTER — Ambulatory Visit (HOSPITAL_COMMUNITY)
Admission: RE | Admit: 2011-12-30 | Discharge: 2011-12-30 | Disposition: A | Discharge status: Home / Self Care | Payer: Self-pay | Source: Ambulatory Visit | Attending: Internal Medicine | Admitting: Internal Medicine

## 2011-12-30 DIAGNOSIS — Z1231 Encounter for screening mammogram for malignant neoplasm of breast: Secondary | ICD-10-CM | POA: Insufficient documentation

## 2012-05-25 IMAGING — RF DG UGI W/O KUB
1 series · 15 of 24 positions shown · non-contrast
Comparison: None

REASON FOR EXAM: Transverse Polyp  Dr Nomasibulele [address]
COMMENTS:

PROCEDURE:     FL  - FL UPPER GI  - March 06, 2010  [DATE]
RESULT:     Indication: Reflux, abdominal discomfort

[Series 1: run · 20 acquisitions, 15 frames shown]
[im 1/20]
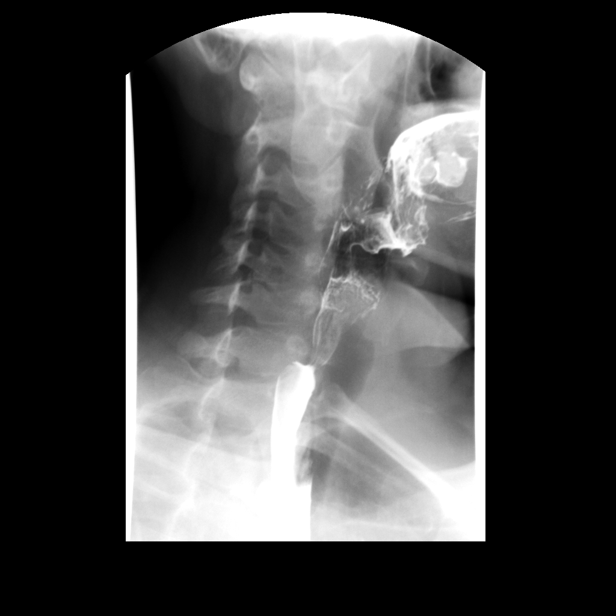
[im 1/20]
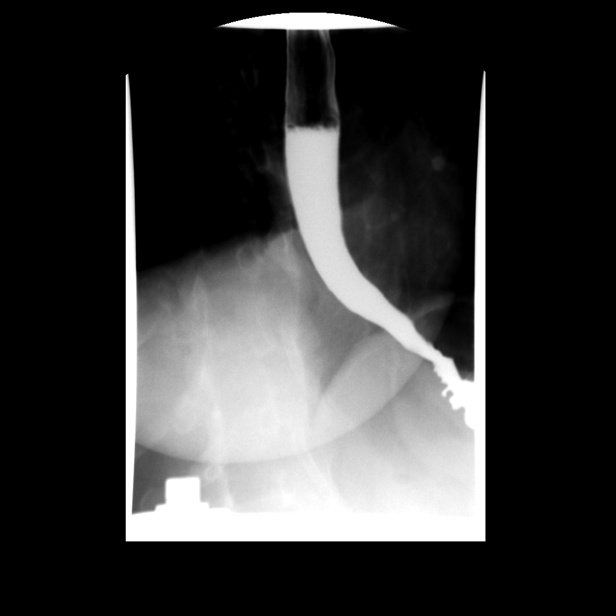
[im 2/20]
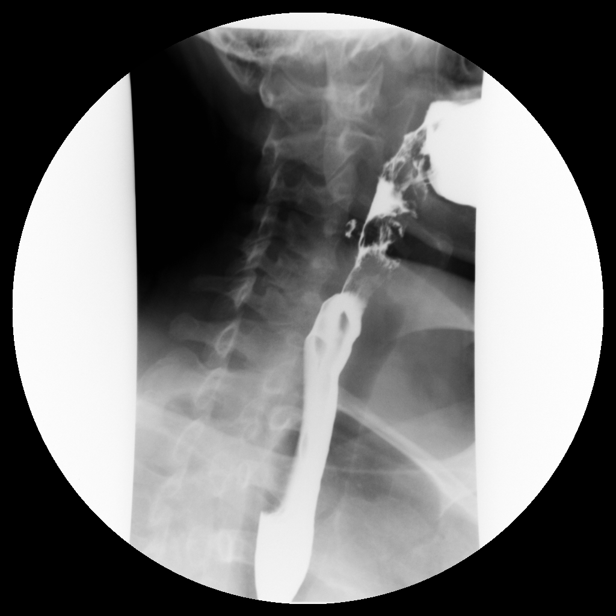
[im 2/20]
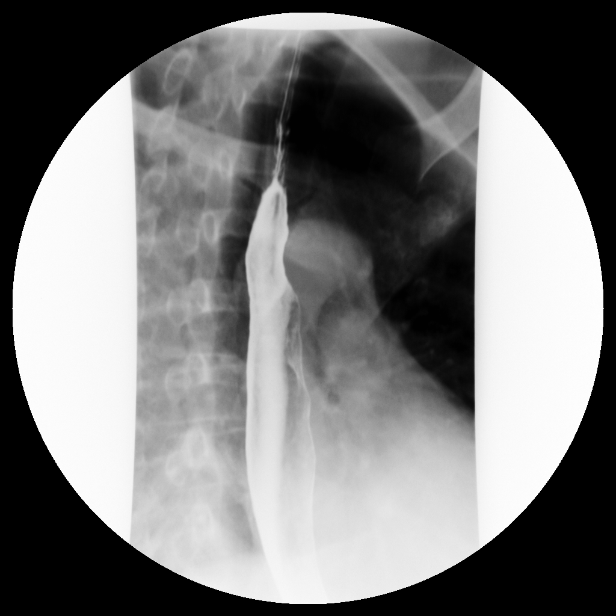
[im 4/20]
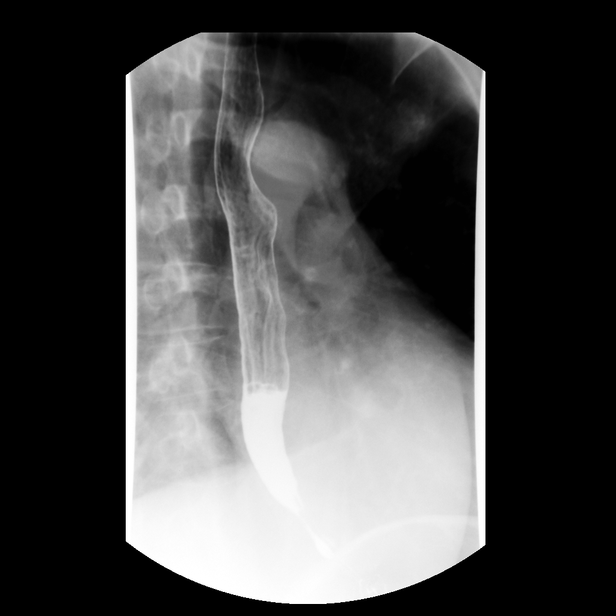
[im 5/20]
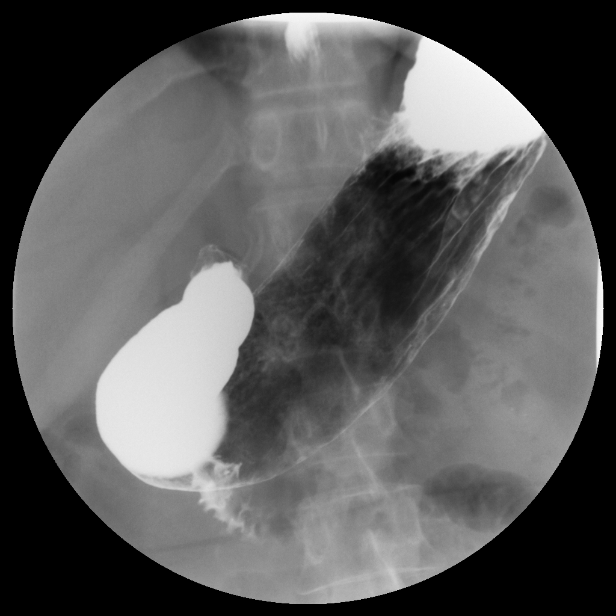
[im 7/20]
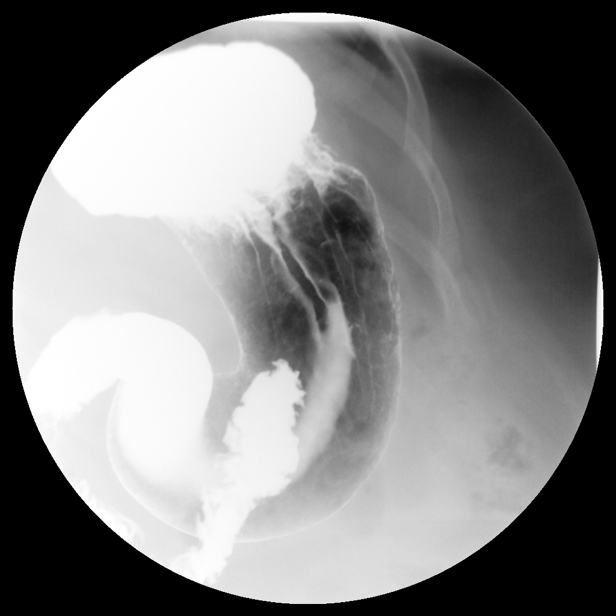
[im 10/20]
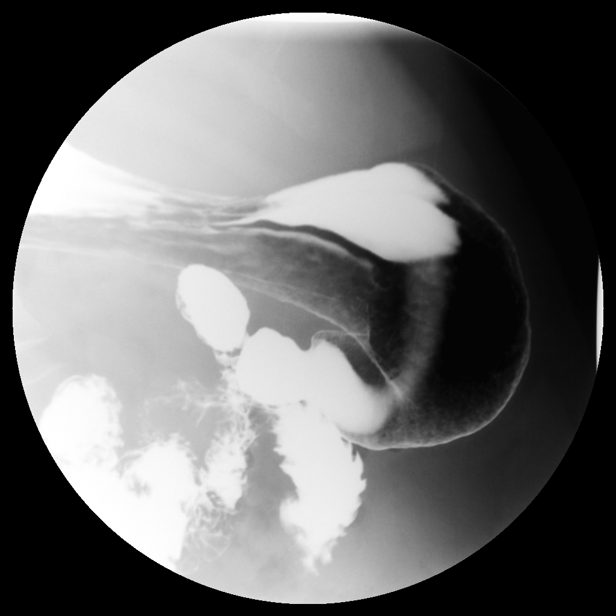
[im 11/20]
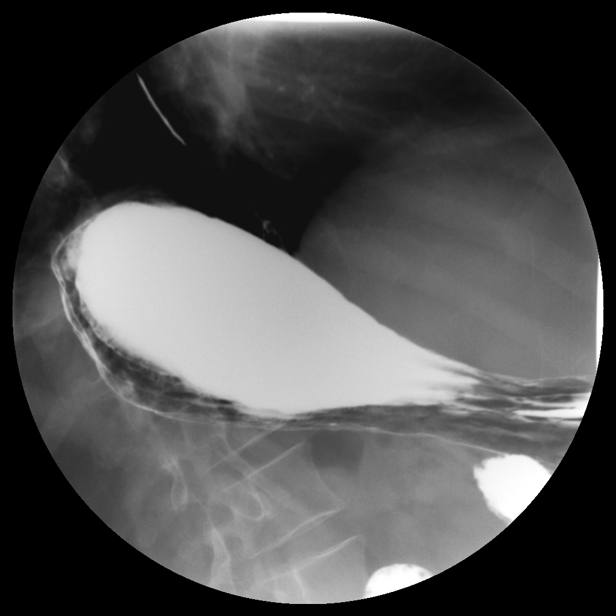
[im 13/20]
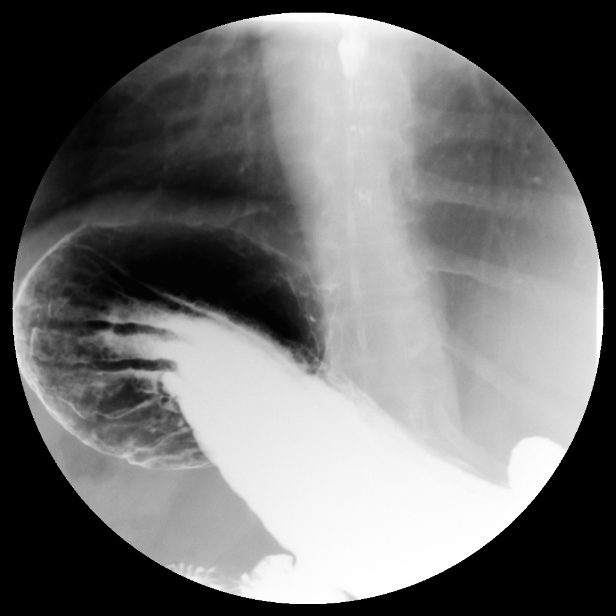
[im 14/20]
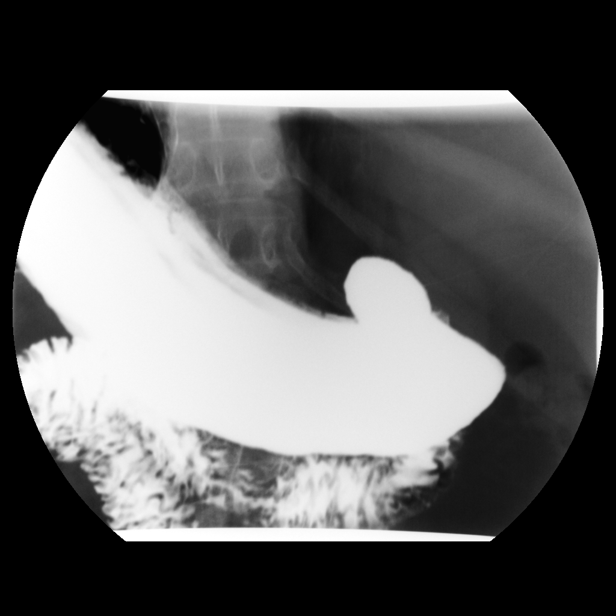
[im 17/20]
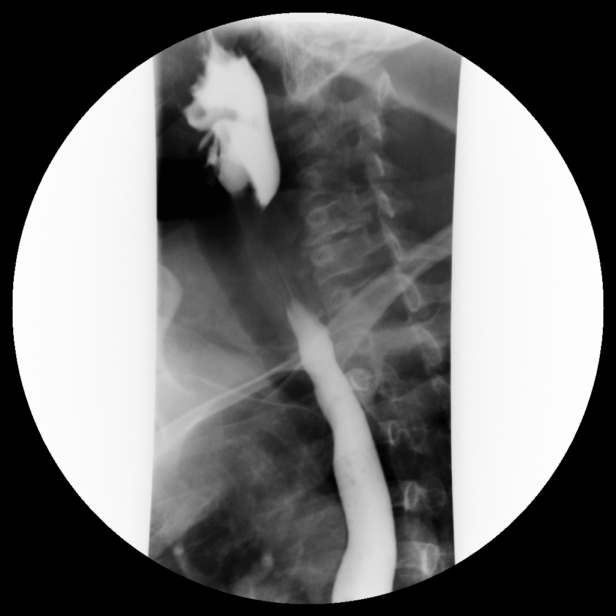
[im 17/20]
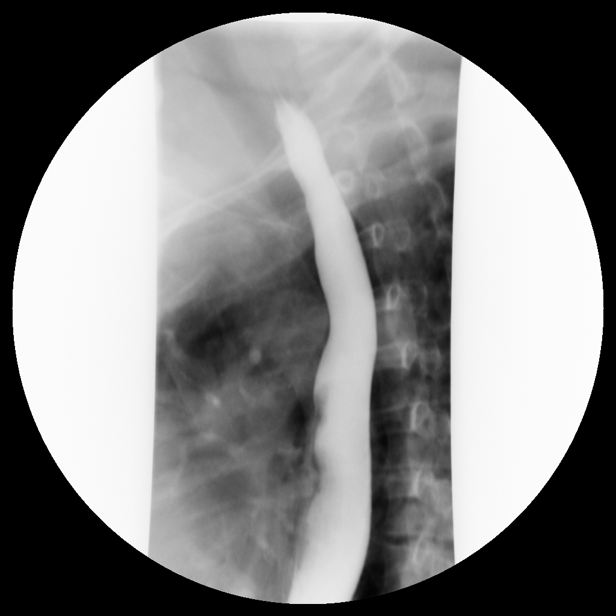
[im 18/20]
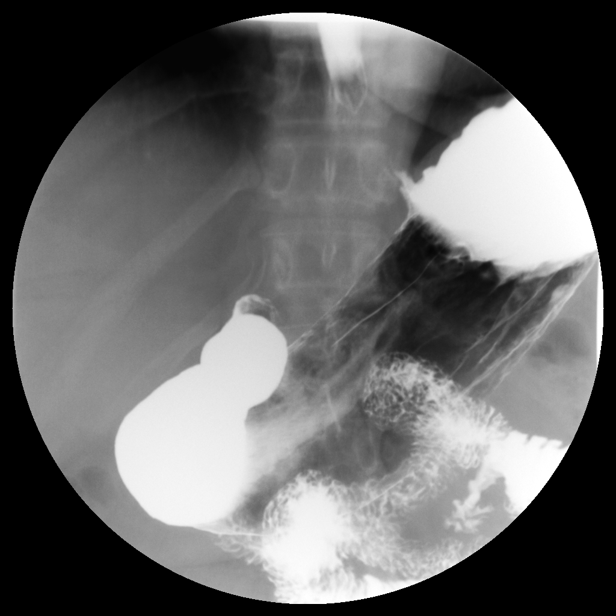
[im 20/20]
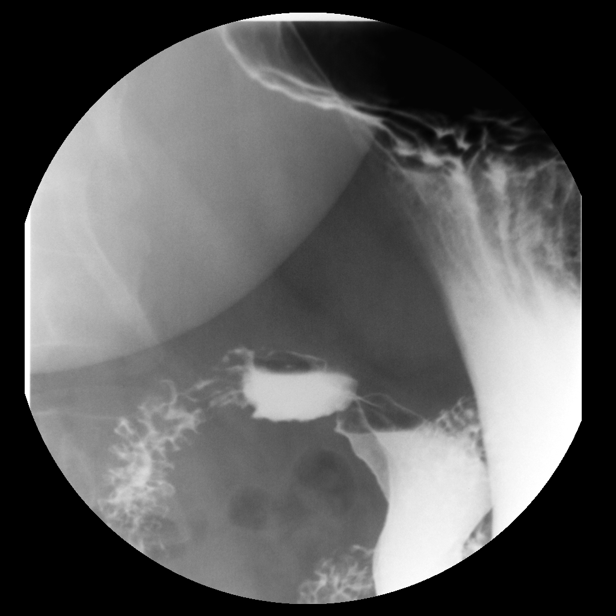

[15 of 24 positions shown; findings below may reference images not displayed]

FINDINGS: Biphasic examination of the esophagus to the distal duodenum was performed
without complication. Total fluoroscopy time was 2.5 minutes.

Examination of the esophagus demonstrated normal esophageal motility. Normal
esophageal morphology without evidence of esophagitis or ulceration. No
esophageal stricture, diverticula, or mass lesion. No evidence of hiatal
hernia. There is spontaneous gastroesophageal reflux.

Examination of the stomach demonstrated normal rugal folds and areae
gastricae. The gastric mucosa appeared unremarkable without evidence of
ulceration, scarring, or mass lesion. Gastric motility and emptying was
normal. Fluoroscopic examination of the duodenum demonstrates normal
motility and morphology without evidence of ulceration or mass lesion.
IMPRESSION: 1. Spontaneous gastroesophageal reflux.

## 2013-11-21 ENCOUNTER — Encounter: Payer: Self-pay | Admitting: Gastroenterology

## 2014-07-17 ENCOUNTER — Ambulatory Visit
Admission: RE | Admit: 2014-07-17 | Discharge: 2014-07-17 | Disposition: A | Discharge status: Home / Self Care | Payer: Commercial Managed Care - HMO | Source: Ambulatory Visit

## 2014-07-17 ENCOUNTER — Other Ambulatory Visit: Payer: Self-pay

## 2014-07-17 DIAGNOSIS — Z1231 Encounter for screening mammogram for malignant neoplasm of breast: Secondary | ICD-10-CM

## 2014-09-13 DIAGNOSIS — E119 Type 2 diabetes mellitus without complications: Secondary | ICD-10-CM | POA: Diagnosis not present

## 2014-09-13 DIAGNOSIS — N289 Disorder of kidney and ureter, unspecified: Secondary | ICD-10-CM | POA: Diagnosis not present

## 2014-09-13 DIAGNOSIS — E785 Hyperlipidemia, unspecified: Secondary | ICD-10-CM | POA: Diagnosis not present

## 2014-09-13 DIAGNOSIS — Z86010 Personal history of colonic polyps: Secondary | ICD-10-CM | POA: Diagnosis not present

## 2014-09-13 DIAGNOSIS — I10 Essential (primary) hypertension: Secondary | ICD-10-CM | POA: Diagnosis not present

## 2014-10-18 DIAGNOSIS — I10 Essential (primary) hypertension: Secondary | ICD-10-CM | POA: Diagnosis not present

## 2014-10-18 DIAGNOSIS — N179 Acute kidney failure, unspecified: Secondary | ICD-10-CM | POA: Diagnosis not present

## 2014-10-18 DIAGNOSIS — R809 Proteinuria, unspecified: Secondary | ICD-10-CM | POA: Diagnosis not present

## 2014-10-18 DIAGNOSIS — E119 Type 2 diabetes mellitus without complications: Secondary | ICD-10-CM | POA: Diagnosis not present

## 2014-10-24 ENCOUNTER — Ambulatory Visit: Payer: Self-pay | Admitting: Nephrology

## 2014-10-24 DIAGNOSIS — N179 Acute kidney failure, unspecified: Secondary | ICD-10-CM | POA: Diagnosis not present

## 2014-11-15 DIAGNOSIS — E780 Pure hypercholesterolemia: Secondary | ICD-10-CM | POA: Diagnosis not present

## 2014-11-15 DIAGNOSIS — I10 Essential (primary) hypertension: Secondary | ICD-10-CM | POA: Diagnosis not present

## 2014-11-15 DIAGNOSIS — Z79899 Other long term (current) drug therapy: Secondary | ICD-10-CM | POA: Diagnosis not present

## 2014-11-15 DIAGNOSIS — E119 Type 2 diabetes mellitus without complications: Secondary | ICD-10-CM | POA: Diagnosis not present

## 2014-11-22 ENCOUNTER — Encounter: Payer: Self-pay | Admitting: Gastroenterology

## 2014-11-26 DIAGNOSIS — E119 Type 2 diabetes mellitus without complications: Secondary | ICD-10-CM | POA: Diagnosis not present

## 2014-11-26 DIAGNOSIS — N179 Acute kidney failure, unspecified: Secondary | ICD-10-CM | POA: Diagnosis not present

## 2014-11-26 DIAGNOSIS — R809 Proteinuria, unspecified: Secondary | ICD-10-CM | POA: Diagnosis not present

## 2014-11-26 DIAGNOSIS — I10 Essential (primary) hypertension: Secondary | ICD-10-CM | POA: Diagnosis not present

## 2015-03-07 DIAGNOSIS — Z79899 Other long term (current) drug therapy: Secondary | ICD-10-CM | POA: Diagnosis not present

## 2015-03-07 DIAGNOSIS — E119 Type 2 diabetes mellitus without complications: Secondary | ICD-10-CM | POA: Diagnosis not present

## 2015-03-07 DIAGNOSIS — E780 Pure hypercholesterolemia: Secondary | ICD-10-CM | POA: Diagnosis not present

## 2015-03-19 DIAGNOSIS — Z79899 Other long term (current) drug therapy: Secondary | ICD-10-CM | POA: Diagnosis not present

## 2015-03-19 DIAGNOSIS — E780 Pure hypercholesterolemia: Secondary | ICD-10-CM | POA: Diagnosis not present

## 2015-03-19 DIAGNOSIS — E119 Type 2 diabetes mellitus without complications: Secondary | ICD-10-CM | POA: Diagnosis not present

## 2015-03-19 DIAGNOSIS — I10 Essential (primary) hypertension: Secondary | ICD-10-CM | POA: Diagnosis not present

## 2015-09-09 DIAGNOSIS — Z79899 Other long term (current) drug therapy: Secondary | ICD-10-CM | POA: Diagnosis not present

## 2015-09-09 DIAGNOSIS — E7800 Pure hypercholesterolemia, unspecified: Secondary | ICD-10-CM | POA: Diagnosis not present

## 2015-09-09 DIAGNOSIS — E119 Type 2 diabetes mellitus without complications: Secondary | ICD-10-CM | POA: Diagnosis not present

## 2015-09-23 DIAGNOSIS — Z0000 Encounter for general adult medical examination without abnormal findings: Secondary | ICD-10-CM | POA: Diagnosis not present

## 2015-09-23 DIAGNOSIS — E1122 Type 2 diabetes mellitus with diabetic chronic kidney disease: Secondary | ICD-10-CM | POA: Diagnosis not present

## 2015-09-23 DIAGNOSIS — E7800 Pure hypercholesterolemia, unspecified: Secondary | ICD-10-CM | POA: Diagnosis not present

## 2015-09-23 DIAGNOSIS — Z23 Encounter for immunization: Secondary | ICD-10-CM | POA: Diagnosis not present

## 2015-09-23 DIAGNOSIS — E1165 Type 2 diabetes mellitus with hyperglycemia: Secondary | ICD-10-CM | POA: Diagnosis not present

## 2015-09-23 DIAGNOSIS — N183 Chronic kidney disease, stage 3 (moderate): Secondary | ICD-10-CM | POA: Diagnosis not present

## 2015-09-23 DIAGNOSIS — Z79899 Other long term (current) drug therapy: Secondary | ICD-10-CM | POA: Diagnosis not present

## 2015-09-24 ENCOUNTER — Other Ambulatory Visit: Payer: Self-pay | Admitting: Family Medicine

## 2015-09-24 DIAGNOSIS — Z1231 Encounter for screening mammogram for malignant neoplasm of breast: Secondary | ICD-10-CM

## 2015-10-15 ENCOUNTER — Ambulatory Visit
Admission: RE | Admit: 2015-10-15 | Discharge: 2015-10-15 | Disposition: A | Discharge status: Home / Self Care | Payer: Commercial Managed Care - HMO | Source: Ambulatory Visit | Attending: Family Medicine | Admitting: Family Medicine

## 2015-10-15 ENCOUNTER — Other Ambulatory Visit: Payer: Self-pay | Admitting: Family Medicine

## 2015-10-15 DIAGNOSIS — Z1231 Encounter for screening mammogram for malignant neoplasm of breast: Secondary | ICD-10-CM | POA: Diagnosis not present

## 2015-10-23 ENCOUNTER — Other Ambulatory Visit: Payer: Self-pay | Admitting: *Deleted

## 2015-10-23 ENCOUNTER — Inpatient Hospital Stay
Admission: RE | Admit: 2015-10-23 | Discharge: 2015-10-23 | Disposition: A | Discharge status: Home / Self Care | Payer: Self-pay | Source: Ambulatory Visit | Attending: *Deleted | Admitting: *Deleted

## 2015-10-23 DIAGNOSIS — Z9289 Personal history of other medical treatment: Secondary | ICD-10-CM

## 2015-12-09 DIAGNOSIS — E1122 Type 2 diabetes mellitus with diabetic chronic kidney disease: Secondary | ICD-10-CM | POA: Diagnosis not present

## 2015-12-09 DIAGNOSIS — N183 Chronic kidney disease, stage 3 (moderate): Secondary | ICD-10-CM | POA: Diagnosis not present

## 2015-12-09 DIAGNOSIS — E1165 Type 2 diabetes mellitus with hyperglycemia: Secondary | ICD-10-CM | POA: Diagnosis not present

## 2015-12-09 DIAGNOSIS — E7800 Pure hypercholesterolemia, unspecified: Secondary | ICD-10-CM | POA: Diagnosis not present

## 2015-12-09 DIAGNOSIS — Z79899 Other long term (current) drug therapy: Secondary | ICD-10-CM | POA: Diagnosis not present

## 2015-12-16 DIAGNOSIS — I10 Essential (primary) hypertension: Secondary | ICD-10-CM | POA: Diagnosis not present

## 2015-12-16 DIAGNOSIS — N183 Chronic kidney disease, stage 3 (moderate): Secondary | ICD-10-CM | POA: Diagnosis not present

## 2015-12-16 DIAGNOSIS — Z79899 Other long term (current) drug therapy: Secondary | ICD-10-CM | POA: Diagnosis not present

## 2015-12-16 DIAGNOSIS — E1122 Type 2 diabetes mellitus with diabetic chronic kidney disease: Secondary | ICD-10-CM | POA: Diagnosis not present

## 2016-04-17 DIAGNOSIS — Z79899 Other long term (current) drug therapy: Secondary | ICD-10-CM | POA: Diagnosis not present

## 2016-04-17 DIAGNOSIS — E1122 Type 2 diabetes mellitus with diabetic chronic kidney disease: Secondary | ICD-10-CM | POA: Diagnosis not present

## 2016-04-17 DIAGNOSIS — E7800 Pure hypercholesterolemia, unspecified: Secondary | ICD-10-CM | POA: Diagnosis not present

## 2016-04-17 DIAGNOSIS — E119 Type 2 diabetes mellitus without complications: Secondary | ICD-10-CM | POA: Diagnosis not present

## 2016-04-17 DIAGNOSIS — I10 Essential (primary) hypertension: Secondary | ICD-10-CM | POA: Diagnosis not present

## 2016-04-17 DIAGNOSIS — N183 Chronic kidney disease, stage 3 (moderate): Secondary | ICD-10-CM | POA: Diagnosis not present

## 2016-09-21 DIAGNOSIS — E7800 Pure hypercholesterolemia, unspecified: Secondary | ICD-10-CM | POA: Diagnosis not present

## 2016-09-21 DIAGNOSIS — E119 Type 2 diabetes mellitus without complications: Secondary | ICD-10-CM | POA: Diagnosis not present

## 2016-09-21 DIAGNOSIS — N183 Chronic kidney disease, stage 3 (moderate): Secondary | ICD-10-CM | POA: Diagnosis not present

## 2016-09-21 DIAGNOSIS — Z79899 Other long term (current) drug therapy: Secondary | ICD-10-CM | POA: Diagnosis not present

## 2016-09-24 ENCOUNTER — Other Ambulatory Visit: Payer: Self-pay | Admitting: Family Medicine

## 2016-09-24 DIAGNOSIS — Z1231 Encounter for screening mammogram for malignant neoplasm of breast: Secondary | ICD-10-CM

## 2016-09-24 DIAGNOSIS — Z0000 Encounter for general adult medical examination without abnormal findings: Secondary | ICD-10-CM | POA: Diagnosis not present

## 2016-10-13 ENCOUNTER — Encounter: Payer: Self-pay | Admitting: Emergency Medicine

## 2016-10-13 ENCOUNTER — Emergency Department
Admission: EM | Admit: 2016-10-13 | Discharge: 2016-10-13 | Disposition: A | Discharge status: Home / Self Care | Payer: Medicare HMO | Attending: Emergency Medicine | Admitting: Emergency Medicine

## 2016-10-13 DIAGNOSIS — Z87891 Personal history of nicotine dependence: Secondary | ICD-10-CM | POA: Insufficient documentation

## 2016-10-13 DIAGNOSIS — M10071 Idiopathic gout, right ankle and foot: Secondary | ICD-10-CM | POA: Insufficient documentation

## 2016-10-13 DIAGNOSIS — I10 Essential (primary) hypertension: Secondary | ICD-10-CM | POA: Diagnosis not present

## 2016-10-13 DIAGNOSIS — Z7984 Long term (current) use of oral hypoglycemic drugs: Secondary | ICD-10-CM | POA: Diagnosis not present

## 2016-10-13 DIAGNOSIS — Z79899 Other long term (current) drug therapy: Secondary | ICD-10-CM | POA: Diagnosis not present

## 2016-10-13 DIAGNOSIS — E119 Type 2 diabetes mellitus without complications: Secondary | ICD-10-CM | POA: Insufficient documentation

## 2016-10-13 DIAGNOSIS — Z7982 Long term (current) use of aspirin: Secondary | ICD-10-CM | POA: Diagnosis not present

## 2016-10-13 DIAGNOSIS — M25571 Pain in right ankle and joints of right foot: Secondary | ICD-10-CM | POA: Diagnosis present

## 2016-10-13 MED ORDER — TRAMADOL HCL 50 MG PO TABS: 50.0000 mg | ORAL_TABLET | Freq: Two times a day (BID) | ORAL | 0 refills | Status: DC | PRN

## 2016-10-13 MED ORDER — COLCHICINE 0.6 MG PO TABS
1.2000 mg | ORAL_TABLET | Freq: Once | ORAL | Status: AC
  Administered 2016-10-13: 1.2 mg via ORAL
  Filled 2016-10-13: qty 2

## 2016-10-13 MED ORDER — ETODOLAC 300 MG PO CAPS: 300.0000 mg | ORAL_CAPSULE | Freq: Two times a day (BID) | ORAL | 2 refills | Status: DC

## 2016-10-13 MED ORDER — TRAMADOL HCL 50 MG PO TABS
50.0000 mg | ORAL_TABLET | Freq: Once | ORAL | Status: AC
  Administered 2016-10-13: 50 mg via ORAL
  Filled 2016-10-13: qty 1

## 2016-10-13 NOTE — ED Provider Notes (Signed)
[facility] Emergency Department Provider Note   ____________________________________________   First MD Initiated Contact with Patient 10/13/16 1259     (approximate)  I have reviewed the triage vital signs and the nursing notes.   HISTORY  Chief Complaint Foot Pain    HPI Yvette Crane is a 66 y.o. female patient complaining of right foot pain and edema for one week. Patient states she has a history of gout in his first flareup dysuria. Patient is rating the pain as 8/10. Patient described a pain as achy increase with weightbearing. No palliative measures taken for this complaint.   Past Medical History:  Diagnosis Date  . Diabetes mellitus   . Gout   . Hyperlipidemia   . Hypertension     Patient Active Problem List   Diagnosis Date Noted  . ANEMIA, MILD 10/04/2010  . GERD 03/06/2010  . GOUT 01/16/2010  . OSTEOPENIA 01/04/2010  . POSTMENOPAUSAL STATUS 09/17/2009  . KNEE PAIN, BILATERAL 08/08/2009  . DEPRESSION 06/04/2009  . CHEST PAIN UNSPECIFIED 05/17/2009  . Disturbance of skin sensation 04/29/2009  . DYSLIPIDEMIA 06/09/2007  . LOW BACK PAIN SYNDROME 06/09/2007  . BURSITIS, HIPS, BILATERAL 06/09/2007  . DM 05/06/2007  . HYPERTENSION 05/06/2007  . VERTIGO 05/06/2007  . 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 ABDOMINAL HYSTERECTOMY    . TUBAL LIGATION      Prior to Admission medications   Medication Sig Start Date End Date Taking? Authorizing Provider  aspirin 81 MG tablet Take 81 mg by mouth daily.      Historical Provider, MD  atorvastatin (LIPITOR) 10 MG tablet Take 10 mg by mouth daily.      Historical Provider, MD  etodolac (LODINE) 300 MG capsule Take 1 capsule (300 mg total) by mouth 2 (two) times daily. 10/13/16   Sable Feil, PA-C  losartan (COZAAR) 100 MG tablet Take 100 mg by mouth daily.      Historical Provider, MD  meclizine (ANTIVERT) 32 MG tablet  Take 32 mg by mouth as needed.      Historical Provider, MD  metFORMIN (GLUCOPHAGE) 500 MG tablet Take 500 mg by mouth 2 (two) times daily with a meal.      Historical Provider, MD  omega-3 acid ethyl esters (LOVAZA) 1 G capsule Take 2 g by mouth 2 (two) times daily.      Historical Provider, MD  traMADol (ULTRAM) 50 MG tablet Take 1 tablet (50 mg total) by mouth every 12 (twelve) hours as needed. 10/13/16   Sable Feil, PA-C    Allergies Patient has no known allergies.  Family History  Problem Relation Age of Onset  . Breast cancer Neg Hx     Social History Social History  Substance Use Topics  . Smoking status: Former Smoker    Quit date: 12/18/1980  . Smokeless tobacco: Never Used  . Alcohol use No    Review of Systems Constitutional: No fever/chills Eyes: No visual changes. ENT: No sore throat. Cardiovascular: Denies chest pain. Respiratory: Denies shortness of breath. Gastrointestinal: No abdominal pain.  No nausea, no vomiting.  No diarrhea.  No constipation. Genitourinary: Negative for dysuria. Musculoskeletal: Negative for back pain. Skin: Negative for rash. Neurological: Negative for headaches, focal weakness or numbness. Psychiatric:Depression.  Endocrine:Hyperlipidemia, hypertension, and diabetes. Hematological/Lymphatic:Anemia  ____________________________________________   PHYSICAL EXAM:  VITAL SIGNS: ED Triage Vitals  Enc Vitals Group     BP 10/13/16  1238 137/86     Pulse Rate 10/13/16 1238 (!) 114     Resp 10/13/16 1238 16     Temp 10/13/16 1238 98.7 F (37.1 C)     Temp Source 10/13/16 1238 Oral     SpO2 10/13/16 1238 98 %     Weight 10/13/16 1238 200 lb (90.7 kg)     Height 10/13/16 1238 5\' 3"  (1.6 m)     Head Circumference --      Peak Flow --      Pain Score 10/13/16 1239 8     Pain Loc --      Pain Edu? --      Excl. in [place]? --     Constitutional: Alert and oriented. Well appearing and in no acute distress. Eyes: Conjunctivae are  normal. PERRL. EOMI. Head: Atraumatic. Nose: No congestion/rhinnorhea. Mouth/Throat: Mucous membranes are moist.  Oropharynx non-erythematous. Neck: No stridor.  No cervical spine tenderness to palpation. Hematological/Lymphatic/Immunilogical: No cervical lymphadenopathy. Cardiovascular: Tachycardic at 114. Will retake before discharge. Grossly normal heart sounds.  Good peripheral circulation. Respiratory: Normal respiratory effort.  No retractions. Lungs CTAB. Gastrointestinal: Soft and nontender. No distention. No abdominal bruits. No CVA tenderness. Musculoskeletal: edema/ erythema right ankle and foot. Moderate guarding with palpation. Neurologic:  Normal speech and language. No gross focal neurologic deficits are appreciated. No gait instability. Skin:  Skin is warm, dry and intact. No rash noted. Psychiatric: Mood and affect are normal. Speech and behavior are normal.  ____________________________________________   LABS (all labs ordered are listed, but only abnormal results are displayed)  Labs Reviewed - No data to display ____________________________________________  EKG   ____________________________________________  RADIOLOGY   ____________________________________________   PROCEDURES  Procedure(s) performed: None  Procedures  Critical Care performed: No  ____________________________________________   INITIAL IMPRESSION / ASSESSMENT AND PLAN / ED COURSE  Pertinent labs & imaging results that were available during my care of the patient were reviewed by me and considered in my medical decision making (see chart for details).  Gout to right ankle and foot. Patient given discharge Instructions. Patient given a prescription for Lodine and Tramadol. Advised follow-up family doctor for continued care.      ____________________________________________   FINAL CLINICAL IMPRESSION(S) / ED DIAGNOSES  Final diagnoses:  Acute idiopathic gout of right ankle        NEW MEDICATIONS STARTED DURING THIS VISIT:  New Prescriptions   ETODOLAC (LODINE) 300 MG CAPSULE    Take 1 capsule (300 mg total) by mouth 2 (two) times daily.   TRAMADOL (ULTRAM) 50 MG TABLET    Take 1 tablet (50 mg total) by mouth every 12 (twelve) hours as needed.     Note:  This document was prepared using Dragon voice recognition software and may include unintentional dictation errors.    Sable Feil, PA-C 10/13/16 [place], PA-C 10/13/16 1324    Eula Listen, MD 10/13/16 1537

## 2016-10-13 NOTE — ED Notes (Signed)
See triage note states she developed pain to right foot about 1 week ago  W/o injury  Hx of gout and feels the same

## 2016-10-13 NOTE — ED Triage Notes (Signed)
Pt reports right foot pain, swelling, difficult to walk since last wednesday. Family hx of gout.

## 2016-10-22 DIAGNOSIS — M10071 Idiopathic gout, right ankle and foot: Secondary | ICD-10-CM | POA: Diagnosis not present

## 2016-10-29 ENCOUNTER — Ambulatory Visit
Admission: RE | Admit: 2016-10-29 | Discharge: 2016-10-29 | Disposition: A | Discharge status: Home / Self Care | Payer: Medicare HMO | Source: Ambulatory Visit | Attending: Family Medicine | Admitting: Family Medicine

## 2016-10-29 ENCOUNTER — Encounter: Payer: Self-pay | Admitting: Radiology

## 2016-10-29 DIAGNOSIS — Z1231 Encounter for screening mammogram for malignant neoplasm of breast: Secondary | ICD-10-CM | POA: Insufficient documentation

## 2017-03-25 DIAGNOSIS — I10 Essential (primary) hypertension: Secondary | ICD-10-CM | POA: Diagnosis not present

## 2017-03-25 DIAGNOSIS — N183 Chronic kidney disease, stage 3 (moderate): Secondary | ICD-10-CM | POA: Diagnosis not present

## 2017-03-25 DIAGNOSIS — E1122 Type 2 diabetes mellitus with diabetic chronic kidney disease: Secondary | ICD-10-CM | POA: Diagnosis not present

## 2017-03-29 DIAGNOSIS — I10 Essential (primary) hypertension: Secondary | ICD-10-CM | POA: Diagnosis not present

## 2017-03-29 DIAGNOSIS — Z79899 Other long term (current) drug therapy: Secondary | ICD-10-CM | POA: Diagnosis not present

## 2017-03-29 DIAGNOSIS — E1122 Type 2 diabetes mellitus with diabetic chronic kidney disease: Secondary | ICD-10-CM | POA: Diagnosis not present

## 2017-03-29 DIAGNOSIS — N183 Chronic kidney disease, stage 3 (moderate): Secondary | ICD-10-CM | POA: Diagnosis not present

## 2017-03-29 DIAGNOSIS — E7800 Pure hypercholesterolemia, unspecified: Secondary | ICD-10-CM | POA: Diagnosis not present

## 2017-03-29 DIAGNOSIS — G3184 Mild cognitive impairment, so stated: Secondary | ICD-10-CM | POA: Diagnosis not present

## 2017-05-01 ENCOUNTER — Encounter: Payer: Self-pay | Admitting: Emergency Medicine

## 2017-05-01 DIAGNOSIS — Z79899 Other long term (current) drug therapy: Secondary | ICD-10-CM | POA: Insufficient documentation

## 2017-05-01 DIAGNOSIS — Z7982 Long term (current) use of aspirin: Secondary | ICD-10-CM | POA: Diagnosis not present

## 2017-05-01 DIAGNOSIS — M25572 Pain in left ankle and joints of left foot: Secondary | ICD-10-CM | POA: Diagnosis present

## 2017-05-01 DIAGNOSIS — Z87891 Personal history of nicotine dependence: Secondary | ICD-10-CM | POA: Diagnosis not present

## 2017-05-01 DIAGNOSIS — I10 Essential (primary) hypertension: Secondary | ICD-10-CM | POA: Insufficient documentation

## 2017-05-01 DIAGNOSIS — Z7984 Long term (current) use of oral hypoglycemic drugs: Secondary | ICD-10-CM | POA: Diagnosis not present

## 2017-05-01 DIAGNOSIS — M10072 Idiopathic gout, left ankle and foot: Secondary | ICD-10-CM | POA: Insufficient documentation

## 2017-05-01 DIAGNOSIS — M109 Gout, unspecified: Secondary | ICD-10-CM | POA: Diagnosis not present

## 2017-05-01 DIAGNOSIS — E119 Type 2 diabetes mellitus without complications: Secondary | ICD-10-CM | POA: Diagnosis not present

## 2017-05-01 DIAGNOSIS — R079 Chest pain, unspecified: Secondary | ICD-10-CM | POA: Diagnosis not present

## 2017-05-02 ENCOUNTER — Emergency Department: Payer: Medicare HMO

## 2017-05-02 ENCOUNTER — Emergency Department
Admission: EM | Admit: 2017-05-02 | Discharge: 2017-05-02 | Disposition: A | Discharge status: Home / Self Care | Payer: Medicare HMO | Attending: Emergency Medicine | Admitting: Emergency Medicine

## 2017-05-02 DIAGNOSIS — R079 Chest pain, unspecified: Secondary | ICD-10-CM | POA: Diagnosis not present

## 2017-05-02 DIAGNOSIS — M109 Gout, unspecified: Secondary | ICD-10-CM

## 2017-05-02 LAB — BASIC METABOLIC PANEL
Anion gap: 10 (ref 5–15)
BUN: 19 mg/dL (ref 6–20)
CHLORIDE: 101 mmol/L (ref 101–111)
CO2: 26 mmol/L (ref 22–32)
Calcium: 9.3 mg/dL (ref 8.9–10.3)
Creatinine, Ser: 1.33 mg/dL — ABNORMAL HIGH (ref 0.44–1.00)
GFR calc Af Amer: 47 mL/min — ABNORMAL LOW (ref >60)
GFR calc non Af Amer: 40 mL/min — ABNORMAL LOW (ref >60)
Glucose, Bld: 158 mg/dL — ABNORMAL HIGH (ref 65–99)
Potassium: 3.5 mmol/L (ref 3.5–5.1)
Sodium: 137 mmol/L (ref 135–145)

## 2017-05-02 LAB — CBC
HCT: 33.7 % — ABNORMAL LOW (ref 35.0–47.0)
Hemoglobin: 11.2 g/dL — ABNORMAL LOW (ref 12.0–16.0)
MCH: 30.6 pg (ref 26.0–34.0)
MCHC: 33.3 g/dL (ref 32.0–36.0)
MCV: 92 fL (ref 80.0–100.0)
Platelets: 306 10*3/uL (ref 150–440)
RBC: 3.66 MIL/uL — ABNORMAL LOW (ref 3.80–5.20)
RDW: 12.6 % (ref 11.5–14.5)
WBC: 16.7 10*3/uL — ABNORMAL HIGH (ref 3.6–11.0)

## 2017-05-02 LAB — TROPONIN I: Troponin I: <0.03 ng/mL (ref <0.03)

## 2017-05-02 MED ORDER — TRAMADOL HCL 50 MG PO TABS: 50.0000 mg | ORAL_TABLET | Freq: Two times a day (BID) | ORAL | 0 refills | Status: AC | PRN

## 2017-05-02 MED ORDER — COLCHICINE 0.6 MG PO TABS: 0.6000 mg | ORAL_TABLET | Freq: Two times a day (BID) | ORAL | 0 refills | Status: DC | PRN

## 2017-05-02 MED ORDER — COLCHICINE 0.6 MG PO TABS
1.2000 mg | ORAL_TABLET | Freq: Once | ORAL | Status: AC
  Administered 2017-05-02: 1.2 mg via ORAL
  Filled 2017-05-02: qty 2

## 2017-05-02 MED ORDER — TRAMADOL HCL 50 MG PO TABS
100.0000 mg | ORAL_TABLET | Freq: Once | ORAL | Status: AC
  Administered 2017-05-02: 100 mg via ORAL
  Filled 2017-05-02: qty 2

## 2017-05-02 NOTE — ED Provider Notes (Addendum)
[facility] Emergency Department Provider Note   First MD Initiated Contact with Patient 05/02/17 [phone]     (approximate)  I have reviewed the triage vital signs and the nursing notes.   HISTORY  Chief Complaint Ankle Pain; Tachycardia; and Chest Pain    HPI Yvette Crane is a 66 y.o. female with below list of chronic medical conditions including gout presents to the emergency department with nontraumatic left ankle pain is currently 10 out of 10times one week. Patient states pain is worse with any movement of the ankle. Patient states symptoms consistent with previous episodes of gout. Patient denies any fever. In addition patient requesting to have "my heart checked while I'm here". Patient denies any chest pain no shortness of breath no nausea or vomiting. Patient denies any palpitations.   Past Medical History:  Diagnosis Date  . Diabetes mellitus   . Gout   . Hyperlipidemia   . Hypertension     Patient Active Problem List   Diagnosis Date Noted  . ANEMIA, MILD 10/04/2010  . GERD 03/06/2010  . GOUT 01/16/2010  . OSTEOPENIA 01/04/2010  . POSTMENOPAUSAL STATUS 09/17/2009  . KNEE PAIN, BILATERAL 08/08/2009  . DEPRESSION 06/04/2009  . CHEST PAIN UNSPECIFIED 05/17/2009  . Disturbance of skin sensation 04/29/2009  . DYSLIPIDEMIA 06/09/2007  . LOW BACK PAIN SYNDROME 06/09/2007  . BURSITIS, HIPS, BILATERAL 06/09/2007  . DM 05/06/2007  . HYPERTENSION 05/06/2007  . VERTIGO 05/06/2007  . 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 ABDOMINAL HYSTERECTOMY    . TUBAL LIGATION      Prior to Admission medications   Medication Sig Start Date End Date Taking? Authorizing Provider  aspirin 81 MG tablet Take 81 mg by mouth daily.      [provider]  atorvastatin (LIPITOR) 10 MG tablet Take 10 mg by mouth daily.      [provider]  etodolac (LODINE) 300 MG capsule Take 1  capsule (300 mg total) by mouth 2 (two) times daily. 10/13/16   Sable Feil, PA-C  losartan (COZAAR) 100 MG tablet Take 100 mg by mouth daily.      [provider]  meclizine (ANTIVERT) 32 MG tablet Take 32 mg by mouth as needed.      [provider]  metFORMIN (GLUCOPHAGE) 500 MG tablet Take 500 mg by mouth 2 (two) times daily with a meal.      [provider]  omega-3 acid ethyl esters (LOVAZA) 1 G capsule Take 2 g by mouth 2 (two) times daily.      [provider]  traMADol (ULTRAM) 50 MG tablet Take 1 tablet (50 mg total) by mouth every 12 (twelve) hours as needed. 10/13/16   Sable Feil, PA-C    Allergies No known drug allergies  Family History  Problem Relation Age of Onset  . Breast cancer Neg Hx     Social History Social History  Substance Use Topics  . Smoking status: Former Smoker    Quit date: 12/18/1980  . Smokeless tobacco: Never Used  . Alcohol use No    Review of Systems Constitutional: No fever/chills Eyes: No visual changes. ENT: No sore throat. Cardiovascular: Denies chest pain. Respiratory: Denies shortness of breath. Gastrointestinal: No abdominal pain.  No nausea, no vomiting.  No diarrhea.  No constipation. Genitourinary: Negative for dysuria. Musculoskeletal: Negative for neck pain.  Negative for back pain.Positive for left ankle  pain Integumentary: Negative for rash. Neurological: Negative for headaches, focal weakness or numbness.  ____________________________________________   PHYSICAL EXAM:  VITAL SIGNS: ED Triage Vitals  Enc Vitals Group     BP 05/01/17 2353 139/90     Pulse Rate 05/01/17 2353 (!) 101     Resp 05/01/17 2353 18     Temp 05/01/17 2353 99.4 F (37.4 C)     Temp Source 05/01/17 2353 Oral     SpO2 05/01/17 2353 99 %     Weight 05/01/17 2354 93.9 kg (207 lb)     Height --      Head Circumference --      Peak Flow --      Pain Score 05/01/17 2353 6     Pain Loc --      Pain Edu? --        Excl. in [place]? --     Constitutional: Alert and oriented. Well appearing and in no acute distress. Eyes: Conjunctivae are normal. Head: Atraumatic. Mouth/Throat: Mucous membranes are moist.  Oropharynx non-erythematous. Neck: No stridor.  Cardiovascular: Normal rate, regular rhythm. Good peripheral circulation. Grossly normal heart sounds. Respiratory: Normal respiratory effort.  No retractions. Lungs CTAB. Gastrointestinal: Soft and nontender. No distention.  Musculoskeletal: Left ankle pain with gentle palpation. Pain with active and passive range of motion of the left ankle. No overlying cellulitic changes to the skin. Palpable PT and DP pulses bilaterally equal  Neurologic:  Normal speech and language. No gross focal neurologic deficits are appreciated.  Skin:  Skin is warm, dry and intact. No rash noted. Psychiatric: Mood and affect are normal. Speech and behavior are normal.  ____________________________________________   LABS (all labs ordered are listed, but only abnormal results are displayed)  Labs Reviewed  BASIC METABOLIC PANEL - Abnormal; Notable for the following:       Result Value   Glucose, Bld 158 (*)    Creatinine, Ser 1.33 (*)    GFR calc non Af Amer 40 (*)    GFR calc Af Amer 47 (*)    All other components within normal limits  CBC - Abnormal; Notable for the following:    WBC 16.7 (*)    RBC 3.66 (*)    Hemoglobin 11.2 (*)    HCT 33.7 (*)    All other components within normal limits  TROPONIN I   ____________________________________________  ED ECG REPORT I, [place] Mayline Dragon, the attending physician, personally viewed and interpreted this ECG.   Date: 05/02/2017  EKG Time: 11:59 PM  Rate: 99  Rhythm: Normal sinus rhythm  Axis: Normal  Intervals: Normal  ST&T Change: None   Procedures   ____________________________________________   INITIAL IMPRESSION / ASSESSMENT AND PLAN / ED COURSE  Pertinent labs & imaging results that were  available during my care of the patient were reviewed by me and considered in my medical decision making (see chart for details).   66 year old female presenting with above stated history of physical exam which is consistent with gout. Patient given colchicine and tramadol emergency Department with improvement of pain.     ____________________________________________  FINAL CLINICAL IMPRESSION(S) / ED DIAGNOSES  Final diagnoses:  Acute gout of left ankle, unspecified cause     MEDICATIONS GIVEN DURING THIS VISIT:  Medications  colchicine tablet 1.2 mg (1.2 mg Oral Given 05/02/17 0426)  traMADol (ULTRAM) tablet 100 mg (100 mg Oral Given 05/02/17 0427)     NEW OUTPATIENT MEDICATIONS STARTED DURING THIS VISIT:  New Prescriptions  No medications on file    Modified Medications   No medications on file    Discontinued Medications   No medications on file     Note:  This document was prepared using Dragon voice recognition software and may include unintentional dictation errors.    Gregor Hams, MD 05/02/17 0505    Gregor Hams, MD 05/02/17 [phone]

## 2017-05-02 NOTE — ED Triage Notes (Signed)
Pt presented with c/o left ankle pain x 1 week; denies injury; says she was treated recently for gout in her right ankle and says now she has gout in the left ankle; is out of her medication she was prescribed for same; pt adds while she's in triage she wants her heart checked out; says sometimes when she lays down she feels like her heart is beating too fast; had symptoms about 3 months ago that lasted for 2 weeks; says any times she's gone to the clinic they don't do anything to check it out; so pt wants her heart checked out tonight while she's here; c/o pain above her left breast at this time; says it feels sore; pt talking in complete coherent sentences; in no acute distress

## 2017-05-02 NOTE — ED Notes (Signed)
Pt states she is here for a gouty left foot. Pt complains of left ankle and foot pain. Pt states while she is here she would like her heart checked. Pt states two weeks pta she had central chest pain that is worse with palpation. Pt states "i think it was gas". Pt states pain "went away, I don't feel it no more". Pt with slight swelling noted to left ankle and redness noted to medial left great toe. 2+ pedal pulse noted and cms intact to left toes. Pt complains of pain to palpation and with movement of left foot.

## 2017-07-05 DIAGNOSIS — M10071 Idiopathic gout, right ankle and foot: Secondary | ICD-10-CM | POA: Diagnosis not present

## 2017-07-30 DIAGNOSIS — M10072 Idiopathic gout, left ankle and foot: Secondary | ICD-10-CM | POA: Diagnosis not present

## 2017-09-22 DIAGNOSIS — N183 Chronic kidney disease, stage 3 (moderate): Secondary | ICD-10-CM | POA: Diagnosis not present

## 2017-09-22 DIAGNOSIS — Z79899 Other long term (current) drug therapy: Secondary | ICD-10-CM | POA: Diagnosis not present

## 2017-09-22 DIAGNOSIS — E1122 Type 2 diabetes mellitus with diabetic chronic kidney disease: Secondary | ICD-10-CM | POA: Diagnosis not present

## 2017-09-22 DIAGNOSIS — E7800 Pure hypercholesterolemia, unspecified: Secondary | ICD-10-CM | POA: Diagnosis not present

## 2017-10-01 ENCOUNTER — Other Ambulatory Visit: Payer: Self-pay | Admitting: Family Medicine

## 2017-10-01 DIAGNOSIS — Z0000 Encounter for general adult medical examination without abnormal findings: Secondary | ICD-10-CM | POA: Diagnosis not present

## 2017-10-01 DIAGNOSIS — Z1231 Encounter for screening mammogram for malignant neoplasm of breast: Secondary | ICD-10-CM

## 2017-11-02 ENCOUNTER — Ambulatory Visit
Admission: RE | Admit: 2017-11-02 | Discharge: 2017-11-02 | Disposition: A | Discharge status: Home / Self Care | Payer: Medicare HMO | Source: Ambulatory Visit | Attending: Family Medicine | Admitting: Family Medicine

## 2017-11-02 DIAGNOSIS — Z1231 Encounter for screening mammogram for malignant neoplasm of breast: Secondary | ICD-10-CM | POA: Diagnosis not present

## 2018-03-24 DIAGNOSIS — E1122 Type 2 diabetes mellitus with diabetic chronic kidney disease: Secondary | ICD-10-CM | POA: Diagnosis not present

## 2018-03-24 DIAGNOSIS — Z79899 Other long term (current) drug therapy: Secondary | ICD-10-CM | POA: Diagnosis not present

## 2018-03-24 DIAGNOSIS — N183 Chronic kidney disease, stage 3 (moderate): Secondary | ICD-10-CM | POA: Diagnosis not present

## 2018-03-24 DIAGNOSIS — E7800 Pure hypercholesterolemia, unspecified: Secondary | ICD-10-CM | POA: Diagnosis not present

## 2018-03-31 DIAGNOSIS — N183 Chronic kidney disease, stage 3 (moderate): Secondary | ICD-10-CM | POA: Diagnosis not present

## 2018-03-31 DIAGNOSIS — E7800 Pure hypercholesterolemia, unspecified: Secondary | ICD-10-CM | POA: Diagnosis not present

## 2018-03-31 DIAGNOSIS — Z79899 Other long term (current) drug therapy: Secondary | ICD-10-CM | POA: Diagnosis not present

## 2018-03-31 DIAGNOSIS — I10 Essential (primary) hypertension: Secondary | ICD-10-CM | POA: Diagnosis not present

## 2018-03-31 DIAGNOSIS — E1122 Type 2 diabetes mellitus with diabetic chronic kidney disease: Secondary | ICD-10-CM | POA: Diagnosis not present

## 2018-04-24 ENCOUNTER — Emergency Department
Admission: EM | Admit: 2018-04-24 | Discharge: 2018-04-24 | Disposition: A | Discharge status: Home / Self Care | Payer: Medicare HMO | Attending: Emergency Medicine | Admitting: Emergency Medicine

## 2018-04-24 ENCOUNTER — Other Ambulatory Visit: Payer: Self-pay

## 2018-04-24 ENCOUNTER — Encounter: Payer: Self-pay | Admitting: Emergency Medicine

## 2018-04-24 DIAGNOSIS — R109 Unspecified abdominal pain: Secondary | ICD-10-CM | POA: Insufficient documentation

## 2018-04-24 DIAGNOSIS — E119 Type 2 diabetes mellitus without complications: Secondary | ICD-10-CM | POA: Insufficient documentation

## 2018-04-24 DIAGNOSIS — Z7984 Long term (current) use of oral hypoglycemic drugs: Secondary | ICD-10-CM | POA: Diagnosis not present

## 2018-04-24 DIAGNOSIS — Z7982 Long term (current) use of aspirin: Secondary | ICD-10-CM | POA: Insufficient documentation

## 2018-04-24 DIAGNOSIS — R197 Diarrhea, unspecified: Secondary | ICD-10-CM | POA: Diagnosis not present

## 2018-04-24 DIAGNOSIS — R112 Nausea with vomiting, unspecified: Secondary | ICD-10-CM | POA: Insufficient documentation

## 2018-04-24 DIAGNOSIS — I10 Essential (primary) hypertension: Secondary | ICD-10-CM | POA: Insufficient documentation

## 2018-04-24 DIAGNOSIS — Z79899 Other long term (current) drug therapy: Secondary | ICD-10-CM | POA: Diagnosis not present

## 2018-04-24 DIAGNOSIS — Z87891 Personal history of nicotine dependence: Secondary | ICD-10-CM | POA: Diagnosis not present

## 2018-04-24 LAB — CBC
HCT: 31.2 % — ABNORMAL LOW (ref 35.0–47.0)
HEMOGLOBIN: 10.5 g/dL — AB (ref 12.0–16.0)
MCH: 31.3 pg (ref 26.0–34.0)
MCHC: 33.8 g/dL (ref 32.0–36.0)
MCV: 92.7 fL (ref 80.0–100.0)
Platelets: 302 10*3/uL (ref 150–440)
RBC: 3.36 MIL/uL — AB (ref 3.80–5.20)
RDW: 13.1 % (ref 11.5–14.5)
WBC: 13.4 10*3/uL — ABNORMAL HIGH (ref 3.6–11.0)

## 2018-04-24 LAB — COMPREHENSIVE METABOLIC PANEL
ALK PHOS: 71 U/L (ref 38–126)
ALT: 13 U/L (ref 0–44)
ANION GAP: 10 (ref 5–15)
AST: 25 U/L (ref 15–41)
Albumin: 3.7 g/dL (ref 3.5–5.0)
BUN: 18 mg/dL (ref 8–23)
CALCIUM: 9 mg/dL (ref 8.9–10.3)
CO2: 25 mmol/L (ref 22–32)
Chloride: 100 mmol/L (ref 98–111)
Creatinine, Ser: 2.1 mg/dL — ABNORMAL HIGH (ref 0.44–1.00)
GFR calc non Af Amer: 23 mL/min — ABNORMAL LOW (ref >60)
GFR, EST AFRICAN AMERICAN: 27 mL/min — AB (ref >60)
Glucose, Bld: 174 mg/dL — ABNORMAL HIGH (ref 70–99)
Potassium: 3.7 mmol/L (ref 3.5–5.1)
SODIUM: 135 mmol/L (ref 135–145)
Total Bilirubin: 0.3 mg/dL (ref 0.3–1.2)
Total Protein: 7.2 g/dL (ref 6.5–8.1)

## 2018-04-24 LAB — LIPASE, BLOOD: Lipase: 55 U/L — ABNORMAL HIGH (ref 11–51)

## 2018-04-24 MED ORDER — SODIUM CHLORIDE 0.9 % IV BOLUS: 1000.0000 mL | Freq: Once | INTRAVENOUS | Status: DC

## 2018-04-24 MED ORDER — ONDANSETRON HCL 4 MG PO TABS: 4.0000 mg | ORAL_TABLET | Freq: Three times a day (TID) | ORAL | 0 refills | Status: DC | PRN

## 2018-04-24 NOTE — ED Provider Notes (Signed)
[facility] Emergency Department Provider Note  ____________________________________________   I have reviewed the triage vital signs and the nursing notes.   HISTORY  Chief Complaint Emesis   History limited by: Not Limited   HPI Yvette Crane is a 67 y.o. female who presents to the emergency department today because of concerns for nausea and emesis.  Patient states that that started this evening.  She has had multiple episodes of nonbloody emesis.  Has been accompanied by episode of nonbloody diarrhea.  Patient states that the symptoms started shortly after she took 1 of her gout medications.  The nausea and emesis has been accompanied by some abdominal discomfort.  Worse on the left side.  Patient denies intestinal disorder.    Per medical record review patient has a history of HLD, HTN, DM, gout  Past Medical History:  Diagnosis Date  . Diabetes mellitus   . Gout   . Hyperlipidemia   . Hypertension     Patient Active Problem List   Diagnosis Date Noted  . ANEMIA, MILD 10/04/2010  . GERD 03/06/2010  . GOUT 01/16/2010  . OSTEOPENIA 01/04/2010  . POSTMENOPAUSAL STATUS 09/17/2009  . KNEE PAIN, BILATERAL 08/08/2009  . DEPRESSION 06/04/2009  . CHEST PAIN UNSPECIFIED 05/17/2009  . Disturbance of skin sensation 04/29/2009  . DYSLIPIDEMIA 06/09/2007  . LOW BACK PAIN SYNDROME 06/09/2007  . BURSITIS, HIPS, BILATERAL 06/09/2007  . DM 05/06/2007  . HYPERTENSION 05/06/2007  . VERTIGO 05/06/2007  . 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 ABDOMINAL HYSTERECTOMY    . TUBAL LIGATION      Prior to Admission medications   Medication Sig Start Date End Date Taking? Authorizing Provider  aspirin 81 MG tablet Take 81 mg by mouth daily.      [provider]  atorvastatin (LIPITOR) 10 MG tablet Take 10 mg by mouth daily.      [provider]  colchicine 0.6 MG tablet Take 1  tablet (0.6 mg total) by mouth 2 (two) times daily as needed (pain). 05/02/17 05/17/17  Gregor Hams, MD  etodolac (LODINE) 300 MG capsule Take 1 capsule (300 mg total) by mouth 2 (two) times daily. 10/13/16   Sable Feil, PA-C  losartan (COZAAR) 100 MG tablet Take 100 mg by mouth daily.      [provider]  meclizine (ANTIVERT) 32 MG tablet Take 32 mg by mouth as needed.      [provider]  metFORMIN (GLUCOPHAGE) 500 MG tablet Take 500 mg by mouth 2 (two) times daily with a meal.      [provider]  omega-3 acid ethyl esters (LOVAZA) 1 G capsule Take 2 g by mouth 2 (two) times daily.      [provider]  traMADol (ULTRAM) 50 MG tablet Take 1 tablet (50 mg total) by mouth every 12 (twelve) hours as needed. 10/13/16   Sable Feil, PA-C  traMADol (ULTRAM) 50 MG tablet Take 1 tablet (50 mg total) by mouth every 12 (twelve) hours as needed. 05/02/17 05/02/18  Gregor Hams, MD    Allergies Patient has no known allergies.  Family History  Problem Relation Age of Onset  . Breast cancer Neg Hx     Social History Social History   Tobacco Use  . Smoking status: Former Smoker    Last attempt to quit: 12/18/1980    Years since quitting: 37.3  . Smokeless  tobacco: Never Used  Substance Use Topics  . Alcohol use: No  . Drug use: No    Review of Systems Constitutional: No fever/chills Eyes: No visual changes. ENT: No sore throat. Cardiovascular: Denies chest pain. Respiratory: Denies shortness of breath. Gastrointestinal: Positive for abdominal pain, nausea vomiting and diarrhea. Genitourinary: Negative for dysuria. Musculoskeletal: Negative for back pain. Skin: Negative for rash. Neurological: Negative for headaches, focal weakness or numbness.  ____________________________________________   PHYSICAL EXAM:  VITAL SIGNS: ED Triage Vitals  Enc Vitals Group     BP 04/24/18 2130 (!) 159/93     Pulse Rate 04/24/18 2130 87     Resp  04/24/18 2130 18     Temp 04/24/18 2130 98.6 F (37 C)     Temp Source 04/24/18 2130 Oral     SpO2 04/24/18 2130 98 %     Weight 04/24/18 2131 215 lb (97.5 kg)     Height 04/24/18 2131 5\' 7"  (1.702 m)   Constitutional: Alert and oriented.  Eyes: Conjunctivae are normal.  ENT      Head: Normocephalic and atraumatic.      Nose: No congestion/rhinnorhea.      Mouth/Throat: Mucous membranes are moist.      Neck: No stridor. Hematological/Lymphatic/Immunilogical: No cervical lymphadenopathy. Cardiovascular: Normal rate, regular rhythm.  No murmurs, rubs, or gallops.  Respiratory: Normal respiratory effort without tachypnea nor retractions. Breath sounds are clear and equal bilaterally. No wheezes/rales/rhonchi. Gastrointestinal: Soft and tender to palpation on the left side. No rebound. No guarding.  Genitourinary: Deferred Musculoskeletal: Normal range of motion in all extremities. No lower extremity edema. Neurologic:  Normal speech and language. No gross focal neurologic deficits are appreciated.  Skin:  Skin is warm, dry and intact. No rash noted. Psychiatric: Mood and affect are normal. Speech and behavior are normal. Patient exhibits appropriate insight and judgment.  ____________________________________________    LABS (pertinent positives/negatives)  Lipase 55 CBC wbc 13.4, hgb 10.5, plt 302 CMP glu 174, cr 2.10  ____________________________________________   EKG  None  ____________________________________________    RADIOLOGY  None  ____________________________________________   PROCEDURES  Procedures  ____________________________________________   INITIAL IMPRESSION / ASSESSMENT AND PLAN / ED COURSE  Pertinent labs & imaging results that were available during my care of the patient were reviewed by me and considered in my medical decision making (see chart for details).   Presented to the emergency department today because of concerns for episodes of  nausea and vomiting.  Patient had some mild discomfort on the left side.  However during the patient states she felt better.  Patient's blood work showed a mild leukocytosis.  At this point I think possible gastroenteritis.  Discussed this with the patient.  Will plan on treating with medications.  Doubt significant intra-abdominal infection given improvement.  Discussed return precautions.   ____________________________________________   FINAL CLINICAL IMPRESSION(S) / ED DIAGNOSES  Final diagnoses:  Nausea vomiting and diarrhea     Note: This dictation was prepared with Dragon dictation. Any transcriptional errors that result from this process are unintentional     Nance Pear, MD 04/25/18 1825

## 2018-04-24 NOTE — ED Notes (Signed)
Patient states that she take medication for her gout twice a day.  This evening after taking the medication she began to "not feel right" and vomited x 3.  Patient states this has not happened before.  Patient is alert and oriented, skin warm and dry color within normal limits.  Patient with no acute distress noted at this time.

## 2018-04-24 NOTE — Discharge Instructions (Addendum)
Please seek medical attention for any high fevers, chest pain, shortness of breath, change in behavior, persistent vomiting, bloody stool or any other new or concerning symptoms.  

## 2018-04-24 NOTE — ED Triage Notes (Signed)
Pt arrives ambulatory to triage with c/o of emesis. Pt states that " I took my gout medicine and threw up". Pt is in NAD.

## 2018-04-24 NOTE — ED Notes (Signed)
When pt asked triage questions about mentrual status pt stated "ain't nothin been up there". Pt also stated when asking a recent falls that she has not fell but "I feel like I'm jerkin away from my body". Pt ambulatory with steady gait.

## 2018-07-17 ENCOUNTER — Encounter: Payer: Self-pay | Admitting: Emergency Medicine

## 2018-07-17 ENCOUNTER — Other Ambulatory Visit: Payer: Self-pay

## 2018-07-17 ENCOUNTER — Emergency Department: Payer: Medicare HMO

## 2018-07-17 ENCOUNTER — Emergency Department
Admission: EM | Admit: 2018-07-17 | Discharge: 2018-07-17 | Disposition: A | Discharge status: Home / Self Care | Payer: Medicare HMO | Attending: Emergency Medicine | Admitting: Emergency Medicine

## 2018-07-17 DIAGNOSIS — I10 Essential (primary) hypertension: Secondary | ICD-10-CM | POA: Diagnosis not present

## 2018-07-17 DIAGNOSIS — Z7982 Long term (current) use of aspirin: Secondary | ICD-10-CM | POA: Diagnosis not present

## 2018-07-17 DIAGNOSIS — Z87891 Personal history of nicotine dependence: Secondary | ICD-10-CM | POA: Insufficient documentation

## 2018-07-17 DIAGNOSIS — M25511 Pain in right shoulder: Secondary | ICD-10-CM | POA: Diagnosis not present

## 2018-07-17 DIAGNOSIS — Y9389 Activity, other specified: Secondary | ICD-10-CM | POA: Diagnosis not present

## 2018-07-17 DIAGNOSIS — S4991XA Unspecified injury of right shoulder and upper arm, initial encounter: Secondary | ICD-10-CM | POA: Diagnosis not present

## 2018-07-17 DIAGNOSIS — S40011A Contusion of right shoulder, initial encounter: Secondary | ICD-10-CM | POA: Diagnosis not present

## 2018-07-17 DIAGNOSIS — Z7984 Long term (current) use of oral hypoglycemic drugs: Secondary | ICD-10-CM | POA: Diagnosis not present

## 2018-07-17 DIAGNOSIS — E119 Type 2 diabetes mellitus without complications: Secondary | ICD-10-CM | POA: Insufficient documentation

## 2018-07-17 DIAGNOSIS — Y92410 Unspecified street and highway as the place of occurrence of the external cause: Secondary | ICD-10-CM | POA: Insufficient documentation

## 2018-07-17 DIAGNOSIS — M79601 Pain in right arm: Secondary | ICD-10-CM | POA: Diagnosis not present

## 2018-07-17 DIAGNOSIS — Y999 Unspecified external cause status: Secondary | ICD-10-CM | POA: Diagnosis not present

## 2018-07-17 NOTE — ED Notes (Signed)
Pt denies hitting head. R radial pulse 2+. Pt can lift shoulder laterally and anteriorly on to level of collar bone.

## 2018-07-17 NOTE — ED Notes (Signed)
Pt in DPOD waiting area, awake and alert; waiting patiently for treatment room
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
## 2018-07-19 DIAGNOSIS — M546 Pain in thoracic spine: Secondary | ICD-10-CM | POA: Diagnosis not present

## 2018-07-19 DIAGNOSIS — G8911 Acute pain due to trauma: Secondary | ICD-10-CM | POA: Diagnosis not present

## 2018-07-19 DIAGNOSIS — M25511 Pain in right shoulder: Secondary | ICD-10-CM | POA: Diagnosis not present

## 2018-09-22 ENCOUNTER — Other Ambulatory Visit: Payer: Self-pay | Admitting: Family Medicine

## 2018-09-22 DIAGNOSIS — Z1231 Encounter for screening mammogram for malignant neoplasm of breast: Secondary | ICD-10-CM

## 2018-10-24 ENCOUNTER — Emergency Department: Payer: Medicare HMO

## 2018-10-24 ENCOUNTER — Observation Stay
Admission: EM | Admit: 2018-10-24 | Discharge: 2018-10-26 | Disposition: A | Discharge status: Home / Self Care | Payer: Medicare HMO | Attending: Internal Medicine | Admitting: Internal Medicine

## 2018-10-24 ENCOUNTER — Other Ambulatory Visit: Payer: Self-pay

## 2018-10-24 ENCOUNTER — Encounter: Payer: Self-pay | Admitting: Emergency Medicine

## 2018-10-24 DIAGNOSIS — M1A9XX0 Chronic gout, unspecified, without tophus (tophi): Secondary | ICD-10-CM | POA: Insufficient documentation

## 2018-10-24 DIAGNOSIS — R0603 Acute respiratory distress: Secondary | ICD-10-CM | POA: Diagnosis present

## 2018-10-24 DIAGNOSIS — J101 Influenza due to other identified influenza virus with other respiratory manifestations: Secondary | ICD-10-CM | POA: Diagnosis present

## 2018-10-24 DIAGNOSIS — E119 Type 2 diabetes mellitus without complications: Secondary | ICD-10-CM | POA: Insufficient documentation

## 2018-10-24 DIAGNOSIS — I10 Essential (primary) hypertension: Secondary | ICD-10-CM | POA: Insufficient documentation

## 2018-10-24 DIAGNOSIS — Z7984 Long term (current) use of oral hypoglycemic drugs: Secondary | ICD-10-CM | POA: Insufficient documentation

## 2018-10-24 DIAGNOSIS — Z87891 Personal history of nicotine dependence: Secondary | ICD-10-CM | POA: Insufficient documentation

## 2018-10-24 DIAGNOSIS — Z79899 Other long term (current) drug therapy: Secondary | ICD-10-CM | POA: Diagnosis not present

## 2018-10-24 DIAGNOSIS — Z7982 Long term (current) use of aspirin: Secondary | ICD-10-CM | POA: Insufficient documentation

## 2018-10-24 DIAGNOSIS — E785 Hyperlipidemia, unspecified: Secondary | ICD-10-CM | POA: Diagnosis not present

## 2018-10-24 DIAGNOSIS — J9600 Acute respiratory failure, unspecified whether with hypoxia or hypercapnia: Principal | ICD-10-CM | POA: Insufficient documentation

## 2018-10-24 LAB — COMPREHENSIVE METABOLIC PANEL
ALBUMIN: 3.8 g/dL (ref 3.5–5.0)
ALT: 17 U/L (ref 0–44)
AST: 44 U/L — AB (ref 15–41)
Alkaline Phosphatase: 57 U/L (ref 38–126)
Anion gap: 12 (ref 5–15)
BILIRUBIN TOTAL: 0.6 mg/dL (ref 0.3–1.2)
BUN: 18 mg/dL (ref 8–23)
CALCIUM: 8.6 mg/dL — AB (ref 8.9–10.3)
CO2: 24 mmol/L (ref 22–32)
Chloride: 97 mmol/L — ABNORMAL LOW (ref 98–111)
Creatinine, Ser: 1.64 mg/dL — ABNORMAL HIGH (ref 0.44–1.00)
GFR calc Af Amer: 37 mL/min — ABNORMAL LOW (ref >60)
GFR calc non Af Amer: 32 mL/min — ABNORMAL LOW (ref >60)
GLUCOSE: 145 mg/dL — AB (ref 70–99)
POTASSIUM: 3.5 mmol/L (ref 3.5–5.1)
SODIUM: 133 mmol/L — AB (ref 135–145)
TOTAL PROTEIN: 7.5 g/dL (ref 6.5–8.1)

## 2018-10-24 LAB — CBC WITH DIFFERENTIAL/PLATELET
ABS IMMATURE GRANULOCYTES: 0.02 10*3/uL (ref 0.00–0.07)
BASOS ABS: 0.1 10*3/uL (ref 0.0–0.1)
Basophils Relative: 1 %
Eosinophils Absolute: 0 10*3/uL (ref 0.0–0.5)
Eosinophils Relative: 0 %
HCT: 36.3 % (ref 36.0–46.0)
HEMOGLOBIN: 11.6 g/dL — AB (ref 12.0–15.0)
IMMATURE GRANULOCYTES: 0 %
LYMPHS ABS: 2.9 10*3/uL (ref 0.7–4.0)
LYMPHS PCT: 38 %
MCH: 30.3 pg (ref 26.0–34.0)
MCHC: 32 g/dL (ref 30.0–36.0)
MCV: 94.8 fL (ref 80.0–100.0)
MONOS PCT: 10 %
Monocytes Absolute: 0.7 10*3/uL (ref 0.1–1.0)
NEUTROS PCT: 51 %
Neutro Abs: 3.9 10*3/uL (ref 1.7–7.7)
Platelets: 277 10*3/uL (ref 150–400)
RBC: 3.83 MIL/uL — ABNORMAL LOW (ref 3.87–5.11)
RDW: 12.2 % (ref 11.5–15.5)
WBC: 7.6 10*3/uL (ref 4.0–10.5)
nRBC: 0 % (ref 0.0–0.2)

## 2018-10-24 LAB — BLOOD GAS, VENOUS
ACID-BASE EXCESS: 0.5 mmol/L (ref 0.0–2.0)
Bicarbonate: 25.4 mmol/L (ref 20.0–28.0)
O2 Saturation: 61.6 %
PCO2 VEN: 41 mmHg — AB (ref 44.0–60.0)
PH VEN: 7.4 (ref 7.250–7.430)
Patient temperature: 37
pO2, Ven: 32 mmHg (ref 32.0–45.0)

## 2018-10-24 LAB — URINALYSIS, COMPLETE (UACMP) WITH MICROSCOPIC
BACTERIA UA: NONE SEEN
BILIRUBIN URINE: NEGATIVE
Glucose, UA: NEGATIVE mg/dL
HGB URINE DIPSTICK: NEGATIVE
Ketones, ur: NEGATIVE mg/dL
Leukocytes,Ua: NEGATIVE
NITRITE: NEGATIVE
PROTEIN: NEGATIVE mg/dL
SPECIFIC GRAVITY, URINE: 1.014 (ref 1.005–1.030)
pH: 5 (ref 5.0–8.0)

## 2018-10-24 LAB — TROPONIN I: Troponin I: <0.03 ng/mL (ref <0.03)

## 2018-10-24 LAB — GLUCOSE, CAPILLARY
Glucose-Capillary: 152 mg/dL — ABNORMAL HIGH (ref 70–99)
Glucose-Capillary: 221 mg/dL — ABNORMAL HIGH (ref 70–99)

## 2018-10-24 LAB — INFLUENZA PANEL BY PCR (TYPE A & B)
INFLAPCR: POSITIVE — AB
Influenza B By PCR: NEGATIVE

## 2018-10-24 MED ORDER — ENOXAPARIN SODIUM 40 MG/0.4ML ~~LOC~~ SOLN
40.0000 mg | SUBCUTANEOUS | Status: DC
  Administered 2018-10-24 – 2018-10-25 (×2): 40 mg via SUBCUTANEOUS
  Filled 2018-10-24 (×2): qty 0.4

## 2018-10-24 MED ORDER — ACETAMINOPHEN 650 MG RE SUPP: 650.0000 mg | Freq: Four times a day (QID) | RECTAL | Status: DC | PRN

## 2018-10-24 MED ORDER — COLCHICINE 0.6 MG PO TABS
0.6000 mg | ORAL_TABLET | Freq: Two times a day (BID) | ORAL | Status: DC | PRN
  Filled 2018-10-24: qty 1

## 2018-10-24 MED ORDER — DEXAMETHASONE SODIUM PHOSPHATE 10 MG/ML IJ SOLN
10.0000 mg | Freq: Once | INTRAMUSCULAR | Status: AC
  Administered 2018-10-24: 10 mg via INTRAVENOUS
  Filled 2018-10-24: qty 1

## 2018-10-24 MED ORDER — ETODOLAC 300 MG PO CAPS
300.0000 mg | ORAL_CAPSULE | Freq: Two times a day (BID) | ORAL | Status: DC
  Administered 2018-10-24 – 2018-10-26 (×4): 300 mg via ORAL
  Filled 2018-10-24 (×6): qty 1

## 2018-10-24 MED ORDER — ONDANSETRON HCL 4 MG/2ML IJ SOLN: 4.0000 mg | Freq: Four times a day (QID) | INTRAMUSCULAR | Status: DC | PRN

## 2018-10-24 MED ORDER — ACETAMINOPHEN 325 MG PO TABS: 650.0000 mg | ORAL_TABLET | Freq: Four times a day (QID) | ORAL | Status: DC | PRN

## 2018-10-24 MED ORDER — ATORVASTATIN CALCIUM 20 MG PO TABS
10.0000 mg | ORAL_TABLET | Freq: Every day | ORAL | Status: DC
  Administered 2018-10-24 – 2018-10-26 (×3): 10 mg via ORAL
  Filled 2018-10-24 (×3): qty 1

## 2018-10-24 MED ORDER — IPRATROPIUM-ALBUTEROL 0.5-2.5 (3) MG/3ML IN SOLN
3.0000 mL | Freq: Once | RESPIRATORY_TRACT | Status: AC
  Administered 2018-10-24: 3 mL via RESPIRATORY_TRACT
  Filled 2018-10-24: qty 3

## 2018-10-24 MED ORDER — POLYETHYLENE GLYCOL 3350 17 G PO PACK
17.0000 g | PACK | Freq: Every day | ORAL | Status: DC | PRN
  Administered 2018-10-25: 17 g via ORAL
  Filled 2018-10-24: qty 1

## 2018-10-24 MED ORDER — SODIUM CHLORIDE 0.9 % IV SOLN
INTRAVENOUS | Status: AC
  Administered 2018-10-24 – 2018-10-25 (×2): via INTRAVENOUS

## 2018-10-24 MED ORDER — ONDANSETRON HCL 4 MG PO TABS: 4.0000 mg | ORAL_TABLET | Freq: Four times a day (QID) | ORAL | Status: DC | PRN

## 2018-10-24 MED ORDER — IPRATROPIUM-ALBUTEROL 0.5-2.5 (3) MG/3ML IN SOLN
3.0000 mL | RESPIRATORY_TRACT | Status: DC | PRN
  Administered 2018-10-24 – 2018-10-26 (×3): 3 mL via RESPIRATORY_TRACT
  Filled 2018-10-24 (×3): qty 3

## 2018-10-24 MED ORDER — OSELTAMIVIR PHOSPHATE 30 MG PO CAPS
30.0000 mg | ORAL_CAPSULE | Freq: Two times a day (BID) | ORAL | Status: DC
  Administered 2018-10-24 – 2018-10-26 (×4): 30 mg via ORAL
  Filled 2018-10-24 (×5): qty 1

## 2018-10-24 MED ORDER — HYDRALAZINE HCL 20 MG/ML IJ SOLN: 10.0000 mg | INTRAMUSCULAR | Status: DC | PRN

## 2018-10-24 MED ORDER — ASPIRIN EC 81 MG PO TBEC
81.0000 mg | DELAYED_RELEASE_TABLET | Freq: Every day | ORAL | Status: DC
  Administered 2018-10-24 – 2018-10-26 (×3): 81 mg via ORAL
  Filled 2018-10-24 (×3): qty 1

## 2018-10-24 MED ORDER — OMEGA-3-ACID ETHYL ESTERS 1 G PO CAPS
2.0000 g | ORAL_CAPSULE | Freq: Two times a day (BID) | ORAL | Status: DC
  Administered 2018-10-24 – 2018-10-26 (×4): 2 g via ORAL
  Filled 2018-10-24 (×4): qty 2

## 2018-10-24 MED ORDER — LOSARTAN POTASSIUM 50 MG PO TABS
100.0000 mg | ORAL_TABLET | Freq: Every day | ORAL | Status: DC
  Administered 2018-10-24 – 2018-10-26 (×3): 100 mg via ORAL
  Filled 2018-10-24 (×3): qty 2

## 2018-10-24 MED ORDER — INSULIN ASPART 100 UNIT/ML ~~LOC~~ SOLN
0.0000 [IU] | Freq: Three times a day (TID) | SUBCUTANEOUS | Status: DC
  Administered 2018-10-24: 3 [IU] via SUBCUTANEOUS
  Administered 2018-10-25: 08:00:00 2 [IU] via SUBCUTANEOUS
  Administered 2018-10-25: 3 [IU] via SUBCUTANEOUS
  Administered 2018-10-26 (×2): 2 [IU] via SUBCUTANEOUS
  Filled 2018-10-24 (×5): qty 1

## 2018-10-24 MED ORDER — BUDESONIDE 0.5 MG/2ML IN SUSP
0.5000 mg | Freq: Two times a day (BID) | RESPIRATORY_TRACT | Status: DC
  Administered 2018-10-24 – 2018-10-26 (×4): 0.5 mg via RESPIRATORY_TRACT
  Filled 2018-10-24 (×4): qty 2

## 2018-10-24 MED ORDER — BENZONATATE 100 MG PO CAPS
200.0000 mg | ORAL_CAPSULE | Freq: Three times a day (TID) | ORAL | Status: DC | PRN
  Administered 2018-10-24 – 2018-10-25 (×2): 200 mg via ORAL
  Filled 2018-10-24 (×3): qty 2

## 2018-10-24 MED ORDER — HYDROCODONE-ACETAMINOPHEN 5-325 MG PO TABS
1.0000 | ORAL_TABLET | ORAL | Status: DC | PRN
  Administered 2018-10-25: 1 via ORAL
  Filled 2018-10-24: qty 1

## 2018-10-24 MED ORDER — MECLIZINE HCL 25 MG PO TABS
32.0000 mg | ORAL_TABLET | ORAL | Status: DC | PRN
  Administered 2018-10-25 – 2018-10-26 (×2): 31.25 mg via ORAL
  Filled 2018-10-24 (×4): qty 0.5

## 2018-10-24 MED ORDER — INSULIN ASPART 100 UNIT/ML ~~LOC~~ SOLN
0.0000 [IU] | Freq: Every day | SUBCUTANEOUS | Status: DC
  Administered 2018-10-24: 2 [IU] via SUBCUTANEOUS
  Filled 2018-10-24: qty 1

## 2018-10-24 NOTE — ED Notes (Signed)
See triage note  Presents with cough and congestion   Sx's started on Thursday   States having subjective fever  But afebrile on arrival   States she is having some discomfort in chest with cough

## 2018-10-24 NOTE — ED Provider Notes (Signed)
[facility] Emergency Department Provider Note  ____________________________________________   First MD Initiated Contact with Patient 10/24/18 1155     (approximate)  I have reviewed the triage vital signs and the nursing notes.   HISTORY  Chief Complaint Cough and Nasal Congestion    HPI Yvette Crane is a 68 y.o. female presents to the emergency department with flulike symptoms, patient complained of fever, chills, body aches.  cough, denies sore throat, denies vomiting, denies diarrhea; denies chest pain or sob.  Sx for 5 days, she states that she has had a lot of wheezing and coughing.  No recent travel or exposure to anyone that has.   Past Medical History:  Diagnosis Date  . Diabetes mellitus   . Gout   . Hyperlipidemia   . Hypertension     Patient Active Problem List   Diagnosis Date Noted  . Influenza A 10/24/2018  . ANEMIA, MILD 10/04/2010  . GERD 03/06/2010  . GOUT 01/16/2010  . OSTEOPENIA 01/04/2010  . POSTMENOPAUSAL STATUS 09/17/2009  . KNEE PAIN, BILATERAL 08/08/2009  . DEPRESSION 06/04/2009  . CHEST PAIN UNSPECIFIED 05/17/2009  . Disturbance of skin sensation 04/29/2009  . DYSLIPIDEMIA 06/09/2007  . LOW BACK PAIN SYNDROME 06/09/2007  . BURSITIS, HIPS, BILATERAL 06/09/2007  . DM 05/06/2007  . HYPERTENSION 05/06/2007  . VERTIGO 05/06/2007  . 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 ABDOMINAL HYSTERECTOMY    . TUBAL LIGATION      Prior to Admission medications   Medication Sig Start Date End Date Taking? Authorizing Provider  allopurinol (ZYLOPRIM) 100 MG tablet Take 100 mg by mouth daily. 07/04/18  Yes [provider]  losartan-hydrochlorothiazide (HYZAAR) 100-12.5 MG tablet Take 1 tablet by mouth daily. 09/26/18 09/26/19 Yes [provider]  metFORMIN (GLUCOPHAGE) 1000 MG tablet Take 1,000 mg by mouth 2 (two) times daily. 09/26/18  Yes  [provider]  nystatin cream (MYCOSTATIN) Apply 1 application topically 2 (two) times daily. 12/16/15  Yes [provider]  tiZANidine (ZANAFLEX) 4 MG tablet Take 4 mg by mouth 3 (three) times daily. 07/19/18 07/19/19 Yes [provider]  aspirin 81 MG tablet Take 81 mg by mouth daily.      [provider]  atorvastatin (LIPITOR) 10 MG tablet Take 10 mg by mouth daily.      [provider]  colchicine 0.6 MG tablet Take 1 tablet (0.6 mg total) by mouth 2 (two) times daily as needed (pain). 05/02/17 05/17/17  Gregor Hams, MD  etodolac (LODINE) 300 MG capsule Take 1 capsule (300 mg total) by mouth 2 (two) times daily. 10/13/16   Sable Feil, PA-C  losartan (COZAAR) 100 MG tablet Take 100 mg by mouth daily.      [provider]  meclizine (ANTIVERT) 32 MG tablet Take 32 mg by mouth as needed.      [provider]  metFORMIN (GLUCOPHAGE) 500 MG tablet Take 500 mg by mouth 2 (two) times daily with a meal.      [provider]  omega-3 acid ethyl esters (LOVAZA) 1 G capsule Take 2 g by mouth 2 (two) times daily.      [provider]  Omega-3 Fatty Acids (OMEGA-3 EPA FISH OIL PO) Take 2 capsules by mouth 2 (two) times daily.    [provider]  pravastatin (PRAVACHOL) 20 MG tablet Take 20 mg by mouth every evening. 09/27/18  [provider]    Allergies Patient has no known allergies.  Family History  Problem Relation Age of Onset  . Breast cancer Neg Hx     Social History Social History   Tobacco Use  . Smoking status: Former Smoker    Last attempt to quit: 12/18/1980    Years since quitting: 37.8  . Smokeless tobacco: Never Used  Substance Use Topics  . Alcohol use: No  . Drug use: No    Review of Systems  Constitutional: Positive fever/chills Eyes: No visual changes. ENT: No sore throat. Respiratory: Positive cough Genitourinary: Negative for dysuria. Musculoskeletal:  Negative for back pain. Skin: Negative for rash.    ____________________________________________   PHYSICAL EXAM:  VITAL SIGNS: ED Triage Vitals [10/24/18 1106]  Enc Vitals Group     BP (!) 156/97     Pulse Rate (!) 111     Resp 16     Temp 99 F (37.2 C)     Temp Source Oral     SpO2 97 %     Weight 216 lb 14.9 oz (98.4 kg)     Height 5\' 4"  (1.626 m)     Head Circumference      Peak Flow      Pain Score 10     Pain Loc      Pain Edu?      Excl. in [place]?     Constitutional: Alert and oriented. Well appearing and in no acute distress. Eyes: Conjunctivae are normal.  Head: Atraumatic. Nose: No congestion/rhinnorhea. Mouth/Throat: Mucous membranes are moist.   Neck:  supple no lymphadenopathy noted Cardiovascular: Normal rate, regular rhythm. Heart sounds are normal Respiratory: Normal respiratory effort.  No retractions, lungs with wheezing bilaterally GU: deferred Musculoskeletal: FROM all extremities, warm and well perfused Neurologic:  Normal speech and language.  Skin:  Skin is warm, dry and intact. No rash noted. Psychiatric: Mood and affect are normal. Speech and behavior are normal.  ____________________________________________   LABS (all labs ordered are listed, but only abnormal results are displayed)  Labs Reviewed  INFLUENZA PANEL BY PCR (TYPE A & B) - Abnormal; Notable for the following components:      Result Value   Influenza A By PCR POSITIVE (*)    All other components within normal limits  CBC WITH DIFFERENTIAL/PLATELET - Abnormal; Notable for the following components:   RBC 3.83 (*)    Hemoglobin 11.6 (*)    All other components within normal limits  COMPREHENSIVE METABOLIC PANEL - Abnormal; Notable for the following components:   Sodium 133 (*)    Chloride 97 (*)    Glucose, Bld 145 (*)    Creatinine, Ser 1.64 (*)    Calcium 8.6 (*)    AST 44 (*)    GFR calc non Af Amer 32 (*)    GFR calc Af Amer 37 (*)    All other components within  normal limits  URINALYSIS, COMPLETE (UACMP) WITH MICROSCOPIC - Abnormal; Notable for the following components:   Color, Urine YELLOW (*)    APPearance CLEAR (*)    All other components within normal limits  BLOOD GAS, VENOUS - Abnormal; Notable for the following components:   pCO2, Ven 41 (*)    All other components within normal limits  TROPONIN I  HIV ANTIBODY (ROUTINE TESTING W REFLEX)  CBC  CREATININE, SERUM   ____________________________________________   ____________________________________________  RADIOLOGY  Chest x-ray is normal, no pneumonia  ____________________________________________   PROCEDURES  Procedure(s) performed: DuoNeb x2 patient is still having difficulty breathing  Procedures    ____________________________________________   INITIAL IMPRESSION / ASSESSMENT AND PLAN / ED COURSE  Pertinent labs & imaging results that were available during my care of the patient were reviewed by me and considered in my medical decision making (see chart for details).   Patient is 68 year old female presents emergency department flulike symptoms.  Physical exam shows a wet cough and wheezing bilaterally.  Flu test is positive for influenza A  Chest x-ray is normal  Patient was given a DuoNeb she continues to have wheezing we will repeat a DuoNeb.   Patient still has a lot of wheezing appears to be weak.  Due to this finding the patient states she does not feel like she can go home I ordered an EKG and lab work.  EKG shows normal sinus rhythm with a nonspecific T wave abnormality, ventricular rate was 90 QRS is 58 MS  CBC shows normal WBC of 7.6, troponin is normal, urinalysis is normal, comprehensive metabolic panel shows a decreased sodium, increased glucose, decreased kidney functions, and 1 elevated liver enzyme.  Discussed the case with Dr. Archie Balboa.  He agrees that due to the respiratory distress associated with the influenza that she should be admitted.   Paged the hospitalist.  He would like for [place] to add a blood gas.  Patient will be admitted due to respiratory distress.  As part of my medical decision making, I reviewed the following data within the [place] notes reviewed and incorporated, Labs reviewed see above, EKG interpreted NSR, Old chart reviewed, Radiograph reviewed chest x-ray is normal, Discussed with admitting physician Dr. Jerelyn Charles, Notes from prior ED visits and [place] Controlled Substance Database  ____________________________________________   FINAL CLINICAL IMPRESSION(S) / ED DIAGNOSES  Final diagnoses:  Acute respiratory distress  Influenza A      NEW MEDICATIONS STARTED DURING THIS VISIT:  New Prescriptions   No medications on file     Note:  This document was prepared using Dragon voice recognition software and may include unintentional dictation errors.    Versie Starks, PA-C 10/24/18 1620    Nance Pear, MD 10/25/18 [phone]

## 2018-10-24 NOTE — ED Triage Notes (Signed)
Says sick with cough and congestion since Friday.

## 2018-10-24 NOTE — ED Notes (Addendum)
ED TO INPATIENT HANDOFF REPORT  ED Nurse Name and Phone #:  Justino Boze/Linda #[phone]  S Name/Age/Gender Yvette Crane 68 y.o. female Room/Bed: ED42A/ED42A  Code Status   Code Status: Not on file  Home/SNF/Other Home Patient oriented to: self, place, time and situation Is this baseline? Yes   Triage Complete: Triage complete  Chief Complaint cough/sob  Triage Note Says sick with cough and congestion since Friday.   Allergies No Known Allergies  Level of Care/Admitting Diagnosis ED Disposition    ED Disposition Condition [facility] Area: [facility] [[id number]]  Level of Care: Med-Surg [16]  Diagnosis: Influenza A [643329]  Admitting Physician: Gorden Harms [[id number]]  Attending Physician: Gorden Harms [[id number]]  Estimated length of stay: past midnight tomorrow  Certification:: I certify this patient will need inpatient services for at least 2 midnights  PT Class (Do Not Modify): Inpatient [101]  PT Acc Code (Do Not Modify): Private [1]       B Medical/Surgery History Past Medical History:  Diagnosis Date  . Diabetes mellitus   . Gout   . Hyperlipidemia   . Hypertension    Past Surgical History:  Procedure Laterality Date  . ABDOMINAL HYSTERECTOMY    . TUBAL LIGATION       A IV Location/Drains/Wounds Patient Lines/Drains/Airways Status   Active Line/Drains/Airways    Name:   Placement date:   Placement time:   Site:   Days:   Peripheral IV 10/24/18 Right;Anterior;Medial Antecubital   10/24/18    1423    Antecubital   less than 1          Intake/Output Last 24 hours No intake or output data in the 24 hours ending 10/24/18 1558  Labs/Imaging Results for orders placed or performed during the hospital encounter of 10/24/18 (from the past 48 hour(s))  Influenza panel by PCR (type A & B)     Status: Abnormal   Collection Time: 10/24/18 12:36 PM  Result Value Ref Range   Influenza A By PCR POSITIVE (A) NEGATIVE    Influenza B By PCR NEGATIVE NEGATIVE    Comment: (NOTE) The Xpert Xpress Flu assay is intended as an aid in the diagnosis of  influenza and should not be used as a sole basis for treatment.  This  assay is FDA approved for nasopharyngeal swab specimens only. Nasal  washings and aspirates are unacceptable for Xpert Xpress Flu testing. Performed at [facility], [address] [place], [place] [address]   CBC with Differential     Status: Abnormal   Collection Time: 10/24/18  2:24 PM  Result Value Ref Range   WBC 7.6 4.0 - 10.5 K/uL   RBC 3.83 (L) 3.87 - 5.11 MIL/uL   Hemoglobin 11.6 (L) 12.0 - 15.0 g/dL   HCT 36.3 36.0 - 46.0 %   MCV 94.8 80.0 - 100.0 fL   MCH 30.3 26.0 - 34.0 pg   MCHC 32.0 30.0 - 36.0 g/dL   RDW 12.2 11.5 - 15.5 %   Platelets 277 150 - 400 K/uL   nRBC 0.0 0.0 - 0.2 %   Neutrophils Relative % 51 %   Neutro Abs 3.9 1.7 - 7.7 K/uL   Lymphocytes Relative 38 %   Lymphs Abs 2.9 0.7 - 4.0 K/uL   Monocytes Relative 10 %   Monocytes Absolute 0.7 0.1 - 1.0 K/uL   Eosinophils Relative 0 %   Eosinophils Absolute 0.0 0.0 - 0.5 K/uL  Basophils Relative 1 %   Basophils Absolute 0.1 0.0 - 0.1 K/uL   Immature Granulocytes 0 %   Abs Immature Granulocytes 0.02 0.00 - 0.07 K/uL    Comment: Performed at [facility], [address] [place], [place] [address]  Comprehensive metabolic panel     Status: Abnormal   Collection Time: 10/24/18  2:24 PM  Result Value Ref Range   Sodium 133 (L) 135 - 145 mmol/L   Potassium 3.5 3.5 - 5.1 mmol/L   Chloride 97 (L) 98 - 111 mmol/L   CO2 24 22 - 32 mmol/L   Glucose, Bld 145 (H) 70 - 99 mg/dL   BUN 18 8 - 23 mg/dL   Creatinine, Ser 1.64 (H) 0.44 - 1.00 mg/dL   Calcium 8.6 (L) 8.9 - 10.3 mg/dL   Total Protein 7.5 6.5 - 8.1 g/dL   Albumin 3.8 3.5 - 5.0 g/dL   AST 44 (H) 15 - 41 U/L   ALT 17 0 - 44 U/L   Alkaline Phosphatase 57 38 - 126 U/L   Total Bilirubin 0.6 0.3 - 1.2 mg/dL   GFR calc non Af Amer 32 (L)  >60 mL/min   GFR calc Af Amer 37 (L) >60 mL/min   Anion gap 12 5 - 15    Comment: Performed at [facility], [address] [place], [place] [address]  Troponin I - Once     Status: None   Collection Time: 10/24/18  2:24 PM  Result Value Ref Range   Troponin I <0.03 <0.03 ng/mL    Comment: Performed at [facility], [address] [place], [place] [address]  Urinalysis, Complete w Microscopic     Status: Abnormal   Collection Time: 10/24/18  2:24 PM  Result Value Ref Range   Color, Urine YELLOW (A) YELLOW   APPearance CLEAR (A) CLEAR   Specific Gravity, Urine 1.014 1.005 - 1.030   pH 5.0 5.0 - 8.0   Glucose, UA NEGATIVE NEGATIVE mg/dL   Hgb urine dipstick NEGATIVE NEGATIVE   Bilirubin Urine NEGATIVE NEGATIVE   Ketones, ur NEGATIVE NEGATIVE mg/dL   Protein, ur NEGATIVE NEGATIVE mg/dL   Nitrite NEGATIVE NEGATIVE   Leukocytes,Ua NEGATIVE NEGATIVE   RBC / HPF 0-5 0 - 5 RBC/hpf   WBC, UA 0-5 0 - 5 WBC/hpf   Bacteria, UA NONE SEEN NONE SEEN   Squamous Epithelial / LPF 0-5 0 - 5   Mucus PRESENT     Comment: Performed at [facility], [address] [place], [place] [address]   Dg Chest 2 View  Result Date: 10/24/2018 CLINICAL DATA:  Cough and congestion. EXAM: CHEST - 2 VIEW COMPARISON:  Chest x-ray dated May 02, 2017. FINDINGS: The heart size and mediastinal contours are within normal limits. Both lungs are clear. The visualized skeletal structures are unremarkable. IMPRESSION: No active cardiopulmonary disease. Electronically Signed   By: Titus Dubin M.D.   On: 10/24/2018 12:35    Pending Labs Unresulted Labs (From admission, onward)    Start     Ordered   10/24/18 1545  Blood gas, venous  ONCE - STAT,   STAT     10/24/18 1544   Signed and Held  HIV antibody (Routine Testing)  Once,   R     Signed and Held   Signed and Held  CBC  (enoxaparin (LOVENOX)    CrCl >/= 30 ml/min)  Once,   R    Comments:  Baseline for enoxaparin  therapy IF NOT  ALREADY DRAWN.  Notify MD if PLT < 100 K.    Signed and Held   Signed and Held  Creatinine, serum  (enoxaparin (LOVENOX)    CrCl >/= 30 ml/min)  Once,   R    Comments:  Baseline for enoxaparin therapy IF NOT ALREADY DRAWN.    Signed and Held   Signed and Held  Creatinine, serum  (enoxaparin (LOVENOX)    CrCl >/= 30 ml/min)  Weekly,   R    Comments:  while on enoxaparin therapy    Signed and Held          Vitals/Pain Today's Vitals   10/24/18 1106 10/24/18 1407 10/24/18 1558  BP: (!) 156/97 113/70   Pulse: (!) 111 90   Resp: 16 18   Temp: 99 F (37.2 C) 99 F (37.2 C)   TempSrc: Oral Oral   SpO2: 97% 97%   Weight: 98.4 kg    Height: 5\' 4"  (1.626 m)    PainSc: 10-Worst pain ever  0-No pain    Isolation Precautions Droplet precaution  Medications Medications  ipratropium-albuterol (DUONEB) 0.5-2.5 (3) MG/3ML nebulizer solution 3 mL (3 mLs Nebulization Given 10/24/18 1238)  ipratropium-albuterol (DUONEB) 0.5-2.5 (3) MG/3ML nebulizer solution 3 mL (3 mLs Nebulization Given 10/24/18 1342)  dexamethasone (DECADRON) injection 10 mg (10 mg Intravenous Given 10/24/18 1425)    Mobility walks Low fall risk   Focused Assessments Cardiac Assessment Handoff:    Lab Results  Component Value Date   TROPONINI <0.03 10/24/2018   No results found for: DDIMER Does the Patient currently have chest pain? No  , Pulmonary Assessment Handoff:  Lung sounds: L Breath Sounds: Expiratory wheezes R Breath Sounds: Expiratory wheezes O2 Device: Room Air        R Recommendations: See Admitting Provider Note  Report given to: Amanda (1C RN)  Additional Notes:

## 2018-10-24 NOTE — Progress Notes (Signed)
PHARMACY NOTE:  ANTIMICROBIAL RENAL DOSAGE ADJUSTMENT  Current antimicrobial regimen includes a mismatch between antimicrobial dosage and estimated renal function.  As per policy approved by the Pharmacy & Therapeutics and Medical Executive Committees, the antimicrobial dosage will be adjusted accordingly.  Current antimicrobial dosage:  75 mg PO BID   Indication: flu  Renal Function:  Estimated Creatinine Clearance: 36.9 mL/min (A) (by C-G formula based on SCr of 1.64 mg/dL (H)). []      On intermittent HD, scheduled: []      On CRRT    Antimicrobial dosage has been changed to:  Tamiflu 30 mg PO BID  Additional comments:   Thank you for allowing pharmacy to be a part of this patient's care.  Orene Desanctis, [facility] 10/24/2018 4:46 PM

## 2018-10-24 NOTE — Progress Notes (Signed)
Family Meeting Note  Advance Directive:yes  Today a meeting took place with the Patient.  Patient is able to participate   The following clinical team members were present during this meeting:MD  The following were discussed:Patient's diagnosis:influenza A , Patient's progosis: Unable to determine and Goals for treatment: Full Code  Additional follow-up to be provided: prn  Time spent during discussion:20 minutes  Gorden Harms, MD

## 2018-10-24 NOTE — ED Notes (Signed)
Admitting MD in with pt    

## 2018-10-24 NOTE — H&P (Signed)
[place] at [place] NAME: Yvette Crane    MR#:  [id number]  DATE OF BIRTH:  November 08, 1950  DATE OF ADMISSION:  10/24/2018  PRIMARY CARE PHYSICIAN: Derinda Late, MD   REQUESTING/REFERRING PHYSICIAN:   CHIEF COMPLAINT:   Chief Complaint  Patient presents with  . Cough  . Nasal Congestion    HISTORY OF PRESENT ILLNESS: Yvette Crane  is a 68 y.o. female with a known history per below, presenting to ER with 3 days of cough, generalized weakness, fatigue, ill-feeling, chest tightness, ER w/u noted for influenza A, sodium 133, chloride 97, Cr at BL 1.6, CXR negative, sob, hypoxia, hospitalist asked to admit, pt now being admitted for acute influenza A infection and respiratory failure.  PAST MEDICAL HISTORY:   Past Medical History:  Diagnosis Date  . Diabetes mellitus   . Gout   . Hyperlipidemia   . Hypertension     PAST SURGICAL HISTORY:  Past Surgical History:  Procedure Laterality Date  . ABDOMINAL HYSTERECTOMY    . TUBAL LIGATION      SOCIAL HISTORY:  Social History   Tobacco Use  . Smoking status: Former Smoker    Last attempt to quit: 12/18/1980    Years since quitting: 37.8  . Smokeless tobacco: Never Used  Substance Use Topics  . Alcohol use: No    FAMILY HISTORY:  Family History  Problem Relation Age of Onset  . Breast cancer Neg Hx     DRUG ALLERGIES: No Known Allergies  REVIEW OF SYSTEMS:   CONSTITUTIONAL: + fever, fatigue, weakness.  EYES: No blurred or double vision.  EARS, NOSE, AND THROAT: No tinnitus or ear pain.  RESPIRATORY: + cough, shortness of breath, wheezing CARDIOVASCULAR: No chest pain, orthopnea, edema.  GASTROINTESTINAL: No nausea, vomiting, diarrhea or abdominal pain.  GENITOURINARY: No dysuria, hematuria.  ENDOCRINE: No polyuria, nocturia,  HEMATOLOGY: No anemia, easy bruising or bleeding SKIN: No rash or lesion. MUSCULOSKELETAL: No joint pain or arthritis.   NEUROLOGIC: No tingling,  numbness, weakness.  PSYCHIATRY: No anxiety or depression.   MEDICATIONS AT HOME:  Prior to Admission medications   Medication Sig Start Date End Date Taking? Authorizing Provider  allopurinol (ZYLOPRIM) 100 MG tablet Take 100 mg by mouth daily. 07/04/18  Yes [provider]  losartan-hydrochlorothiazide (HYZAAR) 100-12.5 MG tablet Take 1 tablet by mouth daily. 09/26/18 09/26/19 Yes [provider]  metFORMIN (GLUCOPHAGE) 1000 MG tablet Take 1,000 mg by mouth 2 (two) times daily. 09/26/18  Yes [provider]  nystatin cream (MYCOSTATIN) Apply 1 application topically 2 (two) times daily. 12/16/15  Yes [provider]  tiZANidine (ZANAFLEX) 4 MG tablet Take 4 mg by mouth 3 (three) times daily. 07/19/18 07/19/19 Yes [provider]  aspirin 81 MG tablet Take 81 mg by mouth daily.      [provider]  atorvastatin (LIPITOR) 10 MG tablet Take 10 mg by mouth daily.      [provider]  colchicine 0.6 MG tablet Take 1 tablet (0.6 mg total) by mouth 2 (two) times daily as needed (pain). 05/02/17 05/17/17  Gregor Hams, MD  etodolac (LODINE) 300 MG capsule Take 1 capsule (300 mg total) by mouth 2 (two) times daily. 10/13/16   Sable Feil, PA-C  losartan (COZAAR) 100 MG tablet Take 100 mg by mouth daily.      [provider]  meclizine (ANTIVERT) 32 MG tablet Take 32 mg by mouth as needed.  [provider]  metFORMIN (GLUCOPHAGE) 500 MG tablet Take 500 mg by mouth 2 (two) times daily with a meal.      [provider]  omega-3 acid ethyl esters (LOVAZA) 1 G capsule Take 2 g by mouth 2 (two) times daily.      [provider]  Omega-3 Fatty Acids (OMEGA-3 EPA FISH OIL PO) Take 2 capsules by mouth 2 (two) times daily.    [provider]  pravastatin (PRAVACHOL) 20 MG tablet Take 20 mg by mouth every evening. 09/27/18   [provider]      PHYSICAL EXAMINATION:   VITAL SIGNS: Blood  pressure 113/70, pulse 90, temperature 99 F (37.2 C), temperature source Oral, resp. rate 18, height 5\' 4"  (1.626 m), weight 98.4 kg, SpO2 97 %.  GENERAL:  68 y.o.-year-old patient lying in the bed with no acute distress.  EYES: Pupils equal, round, reactive to light and accommodation. No scleral icterus. Extraocular muscles intact.  HEENT: Head atraumatic, normocephalic. Oropharynx and nasopharynx clear.  NECK:  Supple, no jugular venous distention. No thyroid enlargement, no tenderness.  LUNGS: Normal breath sounds bilaterally, no wheezing, rales,rhonchi or crepitation. No use of accessory muscles of respiration.  CARDIOVASCULAR: S1, S2 normal. No murmurs, rubs, or gallops.  ABDOMEN: Soft, nontender, nondistended. Bowel sounds present. No organomegaly or mass.  EXTREMITIES: No pedal edema, cyanosis, or clubbing.  NEUROLOGIC: Cranial nerves II through XII are intact. Muscle strength 5/5 in all extremities. Sensation intact. Gait not checked.  PSYCHIATRIC: The patient is alert and oriented x 3.  SKIN: No obvious rash, lesion, or ulcer.   LABORATORY PANEL:   CBC Recent Labs  Lab 10/24/18 1424  WBC 7.6  HGB 11.6*  HCT 36.3  PLT 277  MCV 94.8  MCH 30.3  MCHC 32.0  RDW 12.2  LYMPHSABS 2.9  MONOABS 0.7  EOSABS 0.0  BASOSABS 0.1   ------------------------------------------------------------------------------------------------------------------  Chemistries  Recent Labs  Lab 10/24/18 1424  NA 133*  K 3.5  CL 97*  CO2 24  GLUCOSE 145*  BUN 18  CREATININE 1.64*  CALCIUM 8.6*  AST 44*  ALT 17  ALKPHOS 57  BILITOT 0.6   ------------------------------------------------------------------------------------------------------------------ estimated creatinine clearance is 36.9 mL/min (A) (by C-G formula based on SCr of 1.64 mg/dL (H)). ------------------------------------------------------------------------------------------------------------------ No results for input(s):  TSH, T4TOTAL, T3FREE, THYROIDAB in the last 72 hours.  Invalid input(s): FREET3   Coagulation profile No results for input(s): INR, PROTIME in the last 168 hours. ------------------------------------------------------------------------------------------------------------------- No results for input(s): DDIMER in the last 72 hours. -------------------------------------------------------------------------------------------------------------------  Cardiac Enzymes Recent Labs  Lab 10/24/18 1424  TROPONINI <0.03   ------------------------------------------------------------------------------------------------------------------ Invalid input(s): POCBNP  ---------------------------------------------------------------------------------------------------------------  Urinalysis    Component Value Date/Time   COLORURINE YELLOW (A) 10/24/2018 1424   APPEARANCEUR CLEAR (A) 10/24/2018 1424   LABSPEC 1.014 10/24/2018 1424   PHURINE 5.0 10/24/2018 1424   GLUCOSEU NEGATIVE 10/24/2018 1424   HGBUR NEGATIVE 10/24/2018 1424   HGBUR small 09/17/2009 1400   BILIRUBINUR NEGATIVE 10/24/2018 1424   KETONESUR NEGATIVE 10/24/2018 1424   PROTEINUR NEGATIVE 10/24/2018 1424   UROBILINOGEN 0.2 09/17/2009 1400   NITRITE NEGATIVE 10/24/2018 1424   LEUKOCYTESUR NEGATIVE 10/24/2018 1424     RADIOLOGY: Dg Chest 2 View  Result Date: 10/24/2018 CLINICAL DATA:  Cough and congestion. EXAM: CHEST - 2 VIEW COMPARISON:  Chest x-ray dated May 02, 2017. FINDINGS: The heart size and mediastinal contours are within normal limits. Both lungs are clear. The visualized skeletal structures are unremarkable. IMPRESSION:  No active cardiopulmonary disease. Electronically Signed   By: Titus Dubin M.D.   On: 10/24/2018 12:35    EKG: Orders placed or performed during the hospital encounter of 10/24/18  . ED EKG  . ED EKG  . EKG 12-Lead  . EKG 12-Lead    IMPRESSION AND PLAN: *Acute respiratory failure due  influenza A infection Admit to RNF, supplemental O2 prn w/ weaning as tolerated, BTs prn  *Acute influenza A infection Tamiflu for 5 day course, supportive care prn  *Chronic DM II SSI with accuchecks per routine, ADA diet  *Chronic HTN Stable Continue home regiment  *Chronic HLD Stable Continue statin rx  *Chronic gout Stable Continue allopurinol  All the records are reviewed and case discussed with ED provider. Management plans discussed with the patient, family and they are in agreement.  CODE STATUS:full    TOTAL TIME TAKING CARE OF THIS PATIENT: 35 minutes.    Avel Peace Damond Borchers M.D on 10/24/2018   Between 7am to 6pm - Pager - [phone]  After 6pm go to [email or web address] - password [email or web address] [facility]  Office  [phone]  CC: Primary care physician; Derinda Late, MD   Note: This dictation was prepared with Dragon dictation along with smaller phrase technology. Any transcriptional errors that result from this process are unintentional.

## 2018-10-25 LAB — GLUCOSE, CAPILLARY
Glucose-Capillary: 131 mg/dL — ABNORMAL HIGH (ref 70–99)
Glucose-Capillary: 162 mg/dL — ABNORMAL HIGH (ref 70–99)
Glucose-Capillary: 114 mg/dL — ABNORMAL HIGH (ref 70–99)
Glucose-Capillary: 156 mg/dL — ABNORMAL HIGH (ref 70–99)

## 2018-10-25 NOTE — Care Management Obs Status (Signed)
[place] NOTIFICATION   Patient Details  Name: Yvette Crane MRN: [id number] Date of Birth: 1951-06-10   Medicare Observation Status Notification Given:  Yes    Shelbie Ammons, RN 10/25/2018, 2:31 PM

## 2018-10-25 NOTE — Care Management CC44 (Signed)
Condition Code 44 Documentation Completed  Patient Details  Name: Yvette Crane MRN: [id number] Date of Birth: May 17, 1951   Condition Code 44 given:  Yes Patient signature on Condition Code 44 notice:  Yes Documentation of 2 MD's agreement:  Yes Code 44 added to claim:  Yes    Shelbie Ammons, RN 10/25/2018, 2:31 PM

## 2018-10-26 LAB — GLUCOSE, CAPILLARY
GLUCOSE-CAPILLARY: 126 mg/dL — AB (ref 70–99)
Glucose-Capillary: 127 mg/dL — ABNORMAL HIGH (ref 70–99)

## 2018-10-26 LAB — HIV ANTIBODY (ROUTINE TESTING W REFLEX): HIV Screen 4th Generation wRfx: NONREACTIVE

## 2018-10-26 MED ORDER — GUAIFENESIN-CODEINE 100-10 MG/5ML PO SOLN: 5.0000 mL | Freq: Three times a day (TID) | ORAL | 0 refills | Status: DC | PRN

## 2018-10-26 MED ORDER — ALBUTEROL SULFATE HFA 108 (90 BASE) MCG/ACT IN AERS: 2.0000 | INHALATION_SPRAY | Freq: Four times a day (QID) | RESPIRATORY_TRACT | 0 refills | Status: DC | PRN

## 2018-10-26 MED ORDER — BENZONATATE 200 MG PO CAPS: 200.0000 mg | ORAL_CAPSULE | Freq: Three times a day (TID) | ORAL | 0 refills | Status: DC | PRN

## 2018-10-26 MED ORDER — OSELTAMIVIR PHOSPHATE 30 MG PO CAPS: 30.0000 mg | ORAL_CAPSULE | Freq: Two times a day (BID) | ORAL | 0 refills | Status: AC

## 2018-10-26 MED ORDER — OSELTAMIVIR PHOSPHATE 30 MG PO CAPS: 30.0000 mg | ORAL_CAPSULE | Freq: Two times a day (BID) | ORAL | 0 refills | Status: DC

## 2018-10-26 MED ORDER — ACETAMINOPHEN 325 MG PO TABS: 650.0000 mg | ORAL_TABLET | Freq: Four times a day (QID) | ORAL | 0 refills | Status: AC | PRN

## 2018-10-26 NOTE — Progress Notes (Signed)
[place] at [place] NAME: Yvette Crane    MR#:  [id number]  DATE OF BIRTH:  [birth year]  SUBJECTIVE:  CHIEF COMPLAINT:   Chief Complaint  Patient presents with  . Cough  . Nasal Congestion   Patient came with complaint of shortness of breath and congestion and cough.  Noted to have influenza A.  Feels slightly better today.  Is not hypoxic on resting but feels short of breath on exertion still. REVIEW OF SYSTEMS:  CONSTITUTIONAL: No fever, fatigue or weakness.  EYES: No blurred or double vision.  EARS, NOSE, AND THROAT: No tinnitus or ear pain.  RESPIRATORY: Have cough, shortness of breath, wheezing or hemoptysis.  CARDIOVASCULAR: No chest pain, orthopnea, edema.  GASTROINTESTINAL: No nausea, vomiting, diarrhea or abdominal pain.  GENITOURINARY: No dysuria, hematuria.  ENDOCRINE: No polyuria, nocturia,  HEMATOLOGY: No anemia, easy bruising or bleeding SKIN: No rash or lesion. MUSCULOSKELETAL: No joint pain or arthritis.   NEUROLOGIC: No tingling, numbness, weakness.  PSYCHIATRY: No anxiety or depression.   ROS  DRUG ALLERGIES:  No Known Allergies  VITALS:  Blood pressure 118/69, pulse 71, temperature 98 F (36.7 C), temperature source Oral, resp. rate 16, height 5\' 4"  (1.626 m), weight 98.4 kg, SpO2 94 %.  PHYSICAL EXAMINATION:  GENERAL:  68 y.o.-year-old patient lying in the bed with no acute distress.  EYES: Pupils equal, round, reactive to light and accommodation. No scleral icterus. Extraocular muscles intact.  HEENT: Head atraumatic, normocephalic. Oropharynx and nasopharynx clear.  NECK:  Supple, no jugular venous distention. No thyroid enlargement, no tenderness.  LUNGS: Normal breath sounds bilaterally, some wheezing, some bilateral crepitation. No use of accessory muscles of respiration.  CARDIOVASCULAR: S1, S2 normal. No murmurs, rubs, or gallops.  ABDOMEN: Soft, nontender, nondistended. Bowel sounds present. No  organomegaly or mass.  EXTREMITIES: No pedal edema, cyanosis, or clubbing.  NEUROLOGIC: Cranial nerves II through XII are intact. Muscle strength 5/5 in all extremities. Sensation intact. Gait not checked.  PSYCHIATRIC: The patient is alert and oriented x 3.  SKIN: No obvious rash, lesion, or ulcer.   Physical Exam LABORATORY PANEL:   CBC Recent Labs  Lab 10/24/18 1424  WBC 7.6  HGB 11.6*  HCT 36.3  PLT 277   ------------------------------------------------------------------------------------------------------------------  Chemistries  Recent Labs  Lab 10/24/18 1424  NA 133*  K 3.5  CL 97*  CO2 24  GLUCOSE 145*  BUN 18  CREATININE 1.64*  CALCIUM 8.6*  AST 44*  ALT 17  ALKPHOS 57  BILITOT 0.6   ------------------------------------------------------------------------------------------------------------------  Cardiac Enzymes Recent Labs  Lab 10/24/18 1424  TROPONINI <0.03   ------------------------------------------------------------------------------------------------------------------  RADIOLOGY:  Dg Chest 2 View  Result Date: 10/24/2018 CLINICAL DATA:  Cough and congestion. EXAM: CHEST - 2 VIEW COMPARISON:  Chest x-ray dated May 02, 2017. FINDINGS: The heart size and mediastinal contours are within normal limits. Both lungs are clear. The visualized skeletal structures are unremarkable. IMPRESSION: No active cardiopulmonary disease. Electronically Signed   By: Titus Dubin M.D.   On: 10/24/2018 12:35    ASSESSMENT AND PLAN:   Active Problems:   Influenza A  *Acute respiratory failure due influenza A infection supplemental O2 prn w/ weaning as tolerated, now on room air.  *Acute influenza A infection Tamiflu for 5 day course, supportive care prn  *Chronic DM II SSI with accuchecks per routine, ADA diet  *Chronic HTN Stable Continue home regiment  *Chronic HLD Stable Continue statin rx  *Chronic gout Stable  Continue  allopurinol   All the records are reviewed and case discussed with Care Management/Social Workerr. Management plans discussed with the patient, family and they are in agreement.  CODE STATUS: Full  TOTAL TIME TAKING CARE OF THIS PATIENT: 35 minutes.   Pt still feels SOB on exertion, will monitor tonight.  POSSIBLE D/C IN 1 DAYS, DEPENDING ON CLINICAL CONDITION.   Vaughan Basta M.D on 10/26/2018   Between 7am to 6pm - Pager - [phone]  After 6pm go to [email or web address] - password [email or web address] [facility]  Office  [phone]  CC: Primary care physician; Derinda Late, MD  Note: This dictation was prepared with Dragon dictation along with smaller phrase technology. Any transcriptional errors that result from this process are unintentional.

## 2018-10-26 NOTE — Discharge Summary (Signed)
[facility] [place] NAME: Yvette Crane    MR#:  [id number]  DATE OF BIRTH:  04-24-1951  DATE OF ADMISSION:  10/24/2018 ADMITTING PHYSICIAN: Gorden Harms, MD  DATE OF DISCHARGE: 10/26/2018   PRIMARY CARE PHYSICIAN: Derinda Late, MD    ADMISSION DIAGNOSIS:  Acute respiratory distress [R06.03] Influenza A [J10.1]  DISCHARGE DIAGNOSIS:  Active Problems:   Influenza A   SECONDARY DIAGNOSIS:   Past Medical History:  Diagnosis Date  . Diabetes mellitus   . Gout   . Hyperlipidemia   . Hypertension     HOSPITAL COURSE:   *Acute respiratory failure due influenza A infection supplemental O2 prn w/ weaning as tolerated, now on room air.  *Acute influenza A infection Tamiflu for 5 day course, supportive care prn  Cough syrup and albuterol inhaler as needed.  *Chronic DM II SSI with accuchecks per routine, ADA diet  *Chronic HTN Stable Continue home regiment  *Chronic HLD Stable Continue statin rx  *Chronic gout Stable Continue allopurinol  DISCHARGE CONDITIONS:   stable.  CONSULTS OBTAINED:    DRUG ALLERGIES:  No Known Allergies  DISCHARGE MEDICATIONS:   Allergies as of 10/26/2018   No Known Allergies     Medication List    STOP taking these medications   losartan-hydrochlorothiazide 100-12.5 MG tablet Commonly known as:  HYZAAR   pravastatin 20 MG tablet Commonly known as:  PRAVACHOL     TAKE these medications   acetaminophen 325 MG tablet Commonly known as:  TYLENOL Take 2 tablets (650 mg total) by mouth every 6 (six) hours as needed for mild pain (or Fever >/= 101).   albuterol 108 (90 Base) MCG/ACT inhaler Commonly known as:  PROVENTIL HFA;VENTOLIN HFA Inhale 2 puffs into the lungs every 6 (six) hours as needed for wheezing or shortness of breath.   allopurinol 100 MG tablet Commonly known as:  ZYLOPRIM Take 100 mg by mouth daily.   aspirin 81 MG tablet Take 81 mg by  mouth daily.   atorvastatin 10 MG tablet Commonly known as:  LIPITOR Take 10 mg by mouth daily.   benzonatate 200 MG capsule Commonly known as:  TESSALON Take 1 capsule (200 mg total) by mouth 3 (three) times daily as needed for cough.   colchicine 0.6 MG tablet Take 1 tablet (0.6 mg total) by mouth 2 (two) times daily as needed (pain).   etodolac 300 MG capsule Commonly known as:  LODINE Take 1 capsule (300 mg total) by mouth 2 (two) times daily.   guaiFENesin-codeine 100-10 MG/5ML syrup Take 5 mLs by mouth 3 (three) times daily as needed for cough.   losartan 100 MG tablet Commonly known as:  COZAAR Take 100 mg by mouth daily.   meclizine 32 MG tablet Commonly known as:  ANTIVERT Take 32 mg by mouth as needed.   metFORMIN 1000 MG tablet Commonly known as:  GLUCOPHAGE Take 1,000 mg by mouth 2 (two) times daily. What changed:  Another medication with the same name was removed. Continue taking this medication, and follow the directions you see here.   nystatin cream Commonly known as:  MYCOSTATIN Apply 1 application topically 2 (two) times daily.   omega-3 acid ethyl esters 1 g capsule Commonly known as:  LOVAZA Take 2 g by mouth 2 (two) times daily.   OMEGA-3 EPA FISH OIL PO Take 2 capsules by mouth 2 (two) times daily.   oseltamivir 30 MG capsule Commonly known as:  TAMIFLU Take 1 capsule (30 mg total) by mouth 2 (two) times daily for 3 days.   tiZANidine 4 MG tablet Commonly known as:  ZANAFLEX Take 4 mg by mouth 3 (three) times daily.        DISCHARGE INSTRUCTIONS:    Follow with PMD in 1 week.  If you experience worsening of your admission symptoms, develop shortness of breath, life threatening emergency, suicidal or homicidal thoughts you must seek medical attention immediately by calling 911 or calling your MD immediately  if symptoms less severe.  You Must read complete instructions/literature along with all the possible adverse reactions/side  effects for all the Medicines you take and that have been prescribed to you. Take any new Medicines after you have completely understood and accept all the possible adverse reactions/side effects.   Please note  You were cared for by a hospitalist during your hospital stay. If you have any questions about your discharge medications or the care you received while you were in the hospital after you are discharged, you can call the unit and asked to speak with the hospitalist on call if the hospitalist that took care of you is not available. Once you are discharged, your primary care physician will handle any further medical issues. Please note that NO REFILLS for any discharge medications will be authorized once you are discharged, as it is imperative that you return to your primary care physician (or establish a relationship with a primary care physician if you do not have one) for your aftercare needs so that they can reassess your need for medications and monitor your lab values.    Today   CHIEF COMPLAINT:   Chief Complaint  Patient presents with  . Cough  . Nasal Congestion    HISTORY OF PRESENT ILLNESS:  Yvette Crane  is a 68 y.o. female presenting to ER with 3 days of cough, generalized weakness, fatigue, ill-feeling, chest tightness, ER w/u noted for influenza A, sodium 133, chloride 97, Cr at BL 1.6, CXR negative, sob, hypoxia, hospitalist asked to admit, pt now being admitted for acute influenza A infection and respiratory failure.    VITAL SIGNS:  Blood pressure 113/70, pulse 79, temperature 98 F (36.7 C), temperature source Oral, resp. rate 16, height 5\' 4"  (1.626 m), weight 98.4 kg, SpO2 94 %.  I/O:  No intake or output data in the 24 hours ending 10/26/18 1100  PHYSICAL EXAMINATION:  GENERAL:  68 y.o.-year-old patient lying in the bed with no acute distress.  EYES: Pupils equal, round, reactive to light and accommodation. No scleral icterus. Extraocular muscles intact.   HEENT: Head atraumatic, normocephalic. Oropharynx and nasopharynx clear.  NECK:  Supple, no jugular venous distention. No thyroid enlargement, no tenderness.  LUNGS: Normal breath sounds bilaterally, some wheezing, no crepitation. No use of accessory muscles of respiration.  CARDIOVASCULAR: S1, S2 normal. No murmurs, rubs, or gallops.  ABDOMEN: Soft, non-tender, non-distended. Bowel sounds present. No organomegaly or mass.  EXTREMITIES: No pedal edema, cyanosis, or clubbing.  NEUROLOGIC: Cranial nerves II through XII are intact. Muscle strength 5/5 in all extremities. Sensation intact. Gait not checked.  PSYCHIATRIC: The patient is alert and oriented x 3.  SKIN: No obvious rash, lesion, or ulcer.   DATA REVIEW:   CBC Recent Labs  Lab 10/24/18 1424  WBC 7.6  HGB 11.6*  HCT 36.3  PLT 277    Chemistries  Recent Labs  Lab 10/24/18 1424  NA 133*  K 3.5  CL 97*  CO2 24  GLUCOSE 145*  BUN 18  CREATININE 1.64*  CALCIUM 8.6*  AST 44*  ALT 17  ALKPHOS 57  BILITOT 0.6    Cardiac Enzymes Recent Labs  Lab 10/24/18 1424  TROPONINI <0.03    Microbiology Results  No results found for this or any previous visit.  RADIOLOGY:  Dg Chest 2 View  Result Date: 10/24/2018 CLINICAL DATA:  Cough and congestion. EXAM: CHEST - 2 VIEW COMPARISON:  Chest x-ray dated May 02, 2017. FINDINGS: The heart size and mediastinal contours are within normal limits. Both lungs are clear. The visualized skeletal structures are unremarkable. IMPRESSION: No active cardiopulmonary disease. Electronically Signed   By: Titus Dubin M.D.   On: 10/24/2018 12:35    EKG:   Orders placed or performed during the hospital encounter of 10/24/18  . ED EKG  . ED EKG  . EKG 12-Lead  . EKG 12-Lead      Management plans discussed with the patient, family and they are in agreement.  CODE STATUS:     Code Status Orders  (From admission, onward)         Start     Ordered   10/24/18 1618   Full code  Continuous     10/24/18 1618        Code Status History    This patient has a current code status but no historical code status.      TOTAL TIME TAKING CARE OF THIS PATIENT: 35 minutes.    Vaughan Basta M.D on 10/26/2018 at 11:00 AM  Between 7am to 6pm - Pager - [phone]  After 6pm go to [email or web address] - password [email or web address] [facility]  Office  [phone]  CC: Primary care physician; Derinda Late, MD   Note: This dictation was prepared with Dragon dictation along with smaller phrase technology. Any transcriptional errors that result from this process are unintentional.

## 2019-01-10 ENCOUNTER — Other Ambulatory Visit: Payer: Self-pay

## 2019-01-10 ENCOUNTER — Ambulatory Visit
Admission: RE | Admit: 2019-01-10 | Discharge: 2019-01-10 | Disposition: A | Discharge status: Home / Self Care | Payer: Medicare HMO | Source: Ambulatory Visit | Attending: Family Medicine | Admitting: Family Medicine

## 2019-01-10 DIAGNOSIS — Z1231 Encounter for screening mammogram for malignant neoplasm of breast: Secondary | ICD-10-CM | POA: Diagnosis present

## 2019-12-06 ENCOUNTER — Other Ambulatory Visit: Payer: Self-pay | Admitting: Family Medicine

## 2019-12-06 DIAGNOSIS — Z1231 Encounter for screening mammogram for malignant neoplasm of breast: Secondary | ICD-10-CM

## 2020-01-11 ENCOUNTER — Ambulatory Visit
Admission: RE | Admit: 2020-01-11 | Discharge: 2020-01-11 | Disposition: A | Discharge status: Home / Self Care | Payer: Medicare HMO | Source: Ambulatory Visit | Attending: Family Medicine | Admitting: Family Medicine

## 2020-01-11 DIAGNOSIS — Z1231 Encounter for screening mammogram for malignant neoplasm of breast: Secondary | ICD-10-CM | POA: Diagnosis present

## 2020-03-25 DIAGNOSIS — E1122 Type 2 diabetes mellitus with diabetic chronic kidney disease: Secondary | ICD-10-CM | POA: Diagnosis not present

## 2020-03-25 DIAGNOSIS — N1831 Chronic kidney disease, stage 3a: Secondary | ICD-10-CM | POA: Diagnosis not present

## 2020-03-25 DIAGNOSIS — E7800 Pure hypercholesterolemia, unspecified: Secondary | ICD-10-CM | POA: Diagnosis not present

## 2020-03-25 DIAGNOSIS — Z79899 Other long term (current) drug therapy: Secondary | ICD-10-CM | POA: Diagnosis not present

## 2020-04-01 DIAGNOSIS — E785 Hyperlipidemia, unspecified: Secondary | ICD-10-CM | POA: Diagnosis not present

## 2020-04-01 DIAGNOSIS — N183 Chronic kidney disease, stage 3 unspecified: Secondary | ICD-10-CM | POA: Diagnosis not present

## 2020-04-01 DIAGNOSIS — E1122 Type 2 diabetes mellitus with diabetic chronic kidney disease: Secondary | ICD-10-CM | POA: Diagnosis not present

## 2020-04-01 DIAGNOSIS — Z79899 Other long term (current) drug therapy: Secondary | ICD-10-CM | POA: Diagnosis not present

## 2020-04-01 DIAGNOSIS — I129 Hypertensive chronic kidney disease with stage 1 through stage 4 chronic kidney disease, or unspecified chronic kidney disease: Secondary | ICD-10-CM | POA: Diagnosis not present

## 2020-08-22 DIAGNOSIS — Z0100 Encounter for examination of eyes and vision without abnormal findings: Secondary | ICD-10-CM | POA: Diagnosis not present

## 2020-08-22 DIAGNOSIS — E119 Type 2 diabetes mellitus without complications: Secondary | ICD-10-CM | POA: Diagnosis not present

## 2020-11-26 DIAGNOSIS — E7800 Pure hypercholesterolemia, unspecified: Secondary | ICD-10-CM | POA: Diagnosis not present

## 2020-11-26 DIAGNOSIS — Z79899 Other long term (current) drug therapy: Secondary | ICD-10-CM | POA: Diagnosis not present

## 2020-11-26 DIAGNOSIS — N1831 Chronic kidney disease, stage 3a: Secondary | ICD-10-CM | POA: Diagnosis not present

## 2020-11-26 DIAGNOSIS — E1122 Type 2 diabetes mellitus with diabetic chronic kidney disease: Secondary | ICD-10-CM | POA: Diagnosis not present

## 2020-12-03 ENCOUNTER — Other Ambulatory Visit: Payer: Self-pay | Admitting: Family Medicine

## 2020-12-03 DIAGNOSIS — Z1231 Encounter for screening mammogram for malignant neoplasm of breast: Secondary | ICD-10-CM

## 2020-12-03 DIAGNOSIS — Z79899 Other long term (current) drug therapy: Secondary | ICD-10-CM | POA: Diagnosis not present

## 2020-12-03 DIAGNOSIS — Z1331 Encounter for screening for depression: Secondary | ICD-10-CM | POA: Diagnosis not present

## 2020-12-03 DIAGNOSIS — Z0000 Encounter for general adult medical examination without abnormal findings: Secondary | ICD-10-CM | POA: Diagnosis not present

## 2021-01-13 ENCOUNTER — Ambulatory Visit
Admission: RE | Admit: 2021-01-13 | Discharge: 2021-01-13 | Disposition: A | Discharge status: Home / Self Care | Payer: Medicare HMO | Source: Ambulatory Visit | Attending: Family Medicine | Admitting: Family Medicine

## 2021-01-13 ENCOUNTER — Other Ambulatory Visit: Payer: Self-pay

## 2021-01-13 DIAGNOSIS — Z1231 Encounter for screening mammogram for malignant neoplasm of breast: Secondary | ICD-10-CM | POA: Insufficient documentation

## 2021-06-06 DIAGNOSIS — E1122 Type 2 diabetes mellitus with diabetic chronic kidney disease: Secondary | ICD-10-CM | POA: Diagnosis not present

## 2021-06-06 DIAGNOSIS — E785 Hyperlipidemia, unspecified: Secondary | ICD-10-CM | POA: Diagnosis not present

## 2021-06-06 DIAGNOSIS — E7800 Pure hypercholesterolemia, unspecified: Secondary | ICD-10-CM | POA: Diagnosis not present

## 2021-06-06 DIAGNOSIS — N189 Chronic kidney disease, unspecified: Secondary | ICD-10-CM | POA: Diagnosis not present

## 2021-06-06 DIAGNOSIS — Z23 Encounter for immunization: Secondary | ICD-10-CM | POA: Diagnosis not present

## 2021-06-06 DIAGNOSIS — I129 Hypertensive chronic kidney disease with stage 1 through stage 4 chronic kidney disease, or unspecified chronic kidney disease: Secondary | ICD-10-CM | POA: Diagnosis not present

## 2021-06-06 DIAGNOSIS — Z79899 Other long term (current) drug therapy: Secondary | ICD-10-CM | POA: Diagnosis not present

## 2021-06-06 DIAGNOSIS — N1831 Chronic kidney disease, stage 3a: Secondary | ICD-10-CM | POA: Diagnosis not present

## 2021-06-30 ENCOUNTER — Other Ambulatory Visit: Payer: Self-pay

## 2021-06-30 ENCOUNTER — Emergency Department
Admission: EM | Admit: 2021-06-30 | Discharge: 2021-06-30 | Disposition: A | Discharge status: Home / Self Care | Payer: Medicare HMO | Attending: Emergency Medicine | Admitting: Emergency Medicine

## 2021-06-30 DIAGNOSIS — M5442 Lumbago with sciatica, left side: Secondary | ICD-10-CM | POA: Insufficient documentation

## 2021-06-30 DIAGNOSIS — Z7982 Long term (current) use of aspirin: Secondary | ICD-10-CM | POA: Insufficient documentation

## 2021-06-30 DIAGNOSIS — Z87891 Personal history of nicotine dependence: Secondary | ICD-10-CM | POA: Insufficient documentation

## 2021-06-30 DIAGNOSIS — Z7984 Long term (current) use of oral hypoglycemic drugs: Secondary | ICD-10-CM | POA: Insufficient documentation

## 2021-06-30 DIAGNOSIS — I10 Essential (primary) hypertension: Secondary | ICD-10-CM | POA: Insufficient documentation

## 2021-06-30 DIAGNOSIS — Z79899 Other long term (current) drug therapy: Secondary | ICD-10-CM | POA: Insufficient documentation

## 2021-06-30 DIAGNOSIS — E119 Type 2 diabetes mellitus without complications: Secondary | ICD-10-CM | POA: Diagnosis not present

## 2021-06-30 DIAGNOSIS — M5432 Sciatica, left side: Secondary | ICD-10-CM

## 2021-06-30 DIAGNOSIS — M545 Low back pain, unspecified: Secondary | ICD-10-CM | POA: Diagnosis present

## 2021-06-30 MED ORDER — HYDROCODONE-ACETAMINOPHEN 5-325 MG PO TABS: 1.0000 | ORAL_TABLET | Freq: Four times a day (QID) | ORAL | 0 refills | Status: AC | PRN

## 2021-06-30 MED ORDER — OXYCODONE HCL 5 MG PO TABS
5.0000 mg | ORAL_TABLET | Freq: Once | ORAL | Status: AC
  Administered 2021-06-30: 5 mg via ORAL
  Filled 2021-06-30: qty 1

## 2021-06-30 MED ORDER — PREDNISONE 10 MG (21) PO TBPK: ORAL_TABLET | ORAL | 0 refills | Status: DC

## 2021-06-30 NOTE — ED Provider Notes (Signed)
[facility] Emergency Department Provider Note ____________________________________________  Time seen: Approximately 12:50 PM  I have reviewed the triage vital signs and the nursing notes.  HISTORY  Chief Complaint Back Pain   HPI Yvette Crane is a 70 y.o. female presents with left lower back pain that radiates into the buttock that started yesterday.  No fall or new injury.  She has had no dysuria or noted hematuria.  No previous symptoms of the same.  No alleviating measures attempted prior to arrival.  Past Medical History:  Diagnosis Date   Diabetes mellitus    Gout    Hyperlipidemia    Hypertension     Patient Active Problem List   Diagnosis Date Noted   Influenza A 10/24/2018   ANEMIA, MILD 10/04/2010   GERD 03/06/2010   GOUT 01/16/2010   OSTEOPENIA 01/04/2010   POSTMENOPAUSAL STATUS 09/17/2009   KNEE PAIN, BILATERAL 08/08/2009   DEPRESSION 06/04/2009   CHEST PAIN UNSPECIFIED 05/17/2009   Disturbance of skin sensation 04/29/2009   DYSLIPIDEMIA 06/09/2007   LOW BACK PAIN SYNDROME 06/09/2007   BURSITIS, HIPS, BILATERAL 06/09/2007   DM 05/06/2007   HYPERTENSION 05/06/2007   VERTIGO 05/06/2007   COLONIC POLYPS, ADENOMATOUS, HX OF 07/25/2004    Past Surgical History:  Procedure Laterality Date   ABDOMINAL HYSTERECTOMY     TUBAL LIGATION      Prior to Admission medications   Medication Sig Start Date End Date Taking? Authorizing Provider  HYDROcodone-acetaminophen (NORCO/VICODIN) 5-325 MG tablet Take 1 tablet by mouth every 6 (six) hours as needed for up to 3 days for severe pain. 06/30/21 07/03/21 Yes Kenyon Eichelberger B, FNP  predniSONE (STERAPRED UNI-PAK 21 TAB) 10 MG (21) TBPK tablet Take 6 tablets on the first day and decrease by 1 tablet each day until finished. 06/30/21  Yes Assyria Morreale B, FNP  acetaminophen (TYLENOL) 325 MG tablet Take 2 tablets (650 mg total) by mouth every 6 (six) hours as needed for mild pain (or Fever >/=  101). 10/26/18   Vaughan Basta, MD  albuterol (PROVENTIL HFA;VENTOLIN HFA) 108 (90 Base) MCG/ACT inhaler Inhale 2 puffs into the lungs every 6 (six) hours as needed for wheezing or shortness of breath. 10/26/18   Vaughan Basta, MD  allopurinol (ZYLOPRIM) 100 MG tablet Take 100 mg by mouth daily. 07/04/18   [provider]  aspirin 81 MG tablet Take 81 mg by mouth daily.      [provider]  atorvastatin (LIPITOR) 10 MG tablet Take 10 mg by mouth daily.      [provider]  benzonatate (TESSALON) 200 MG capsule Take 1 capsule (200 mg total) by mouth 3 (three) times daily as needed for cough. 10/26/18   Vaughan Basta, MD  colchicine 0.6 MG tablet Take 1 tablet (0.6 mg total) by mouth 2 (two) times daily as needed (pain). 05/02/17 05/17/17  Gregor Hams, MD  etodolac (LODINE) 300 MG capsule Take 1 capsule (300 mg total) by mouth 2 (two) times daily. 10/13/16   Sable Feil, PA-C  guaiFENesin-codeine 100-10 MG/5ML syrup Take 5 mLs by mouth 3 (three) times daily as needed for cough. 10/26/18   Vaughan Basta, MD  losartan (COZAAR) 100 MG tablet Take 100 mg by mouth daily.      [provider]  meclizine (ANTIVERT) 32 MG tablet Take 32 mg by mouth as needed.      [provider]  metFORMIN (GLUCOPHAGE) 1000 MG tablet Take 1,000 mg by mouth 2 (  two) times daily. 09/26/18   [provider]  nystatin cream (MYCOSTATIN) Apply 1 application topically 2 (two) times daily. 12/16/15   [provider]  omega-3 acid ethyl esters (LOVAZA) 1 G capsule Take 2 g by mouth 2 (two) times daily.      [provider]  Omega-3 Fatty Acids (OMEGA-3 EPA FISH OIL PO) Take 2 capsules by mouth 2 (two) times daily.    [provider]    Allergies Patient has no known allergies.  Family History  Problem Relation Age of Onset   Breast cancer Neg Hx     Social History Social History   Tobacco Use   Smoking  status: Former    Types: Cigarettes    Quit date: 12/18/1980    Years since quitting: 40.5   Smokeless tobacco: Never  Substance Use Topics   Alcohol use: No   Drug use: No    Review of Systems Constitutional: Well appearing. Respiratory: Negative for dyspnea. Cardiovascular: Negative for change in skin temperature or color. Musculoskeletal:   Negative for chronic steroid use   Negative for trauma in the presence of osteoporosis  Negative for age over 43 and trauma.  Negative for constitutional symptoms, or history of cancer  Negative for pain worse at night. Skin: Negative for rash, lesion, or wound.  Genitourinary: Negative for urinary retention. Rectal: Negative for fecal incontinence or new onset constipation/bowel habit changes. Hematological/Immunilogical: Negative for immunosuppression, IV drug use, or fever Neurological: Positive for burning, tingling, numb, electric, radiating pain in the left buttock.                        Negative for saddle anesthesia.                        Negative for focal neurologic deficit, progressive or disabling symptoms             Negative for saddle anesthesia. ____________________________________________   PHYSICAL EXAM:  VITAL SIGNS: ED Triage Vitals [06/30/21 1117]  Enc Vitals Group     BP (!) 141/85     Pulse Rate 93     Resp 16     Temp 98.8 F (37.1 C)     Temp Source Oral     SpO2 98 %     Weight      Height      Head Circumference      Peak Flow      Pain Score      Pain Loc      Pain Edu?      Excl. in [place]?     Constitutional: Alert and oriented. Well appearing and in no acute distress. Eyes: Conjunctivae are clear without discharge or drainage.  Head: Atraumatic. Neck: Full, active range of motion. Respiratory: Respirations even and unlabored. Musculoskeletal: Age-appropriate ROM of the back and extremities, Strength 5/5 of the lower extremities as tested. Neurologic: Reflexes of the lower extremities are 2+.   Positive straight leg raise on the left side. Skin: Atraumatic.  Psychiatric: Behavior and affect are normal.  ____________________________________________   LABS (all labs ordered are listed, but only abnormal results are displayed)  Labs Reviewed - No data to display ____________________________________________  RADIOLOGY  Not indicated ____________________________________________   PROCEDURES  Procedure(s) performed:  Procedures ____________________________________________   INITIAL IMPRESSION / ASSESSMENT AND PLAN / ED COURSE  Yvette Crane is a 70 y.o. female presenting to the emergency department  for treatment and evaluation of left lower back pain that radiates into the buttock.  Patient describes this as a burning shooting sensation.  She states that she has been sitting a lot more since the weather has gotten cold and is no longer walking the track like she used to.  Plan will be to treat her with prednisone and a very short course of Norco.  Patient was advised that she should follow-up with her primary care provider if her symptoms or not improving over the week with medications.  If symptoms change or worsen, she is to return to the emergency department.  Medications  oxyCODONE (Oxy IR/ROXICODONE) immediate release tablet 5 mg (5 mg Oral Given 06/30/21 1412)    ED Discharge Orders          Ordered    predniSONE (STERAPRED UNI-PAK 21 TAB) 10 MG (21) TBPK tablet        06/30/21 1343    HYDROcodone-acetaminophen (NORCO/VICODIN) 5-325 MG tablet  Every 6 hours PRN        06/30/21 1343               As part of my medical decision making, I reviewed the following data within the [place] Controlled Substance Database   Pertinent labs & imaging results that were available during my care of the patient were reviewed by me and considered in my medical decision making (see chart for details).    _________________________________________   FINAL CLINICAL IMPRESSION(S) / ED DIAGNOSES   Final diagnoses:  Sciatica of left side     If controlled substance prescribed during this visit, 12 month history viewed on the [place] prior to issuing an initial prescription for Schedule II or III opiod.    Victorino Dike, FNP 06/30/21 1520    Duffy Bruce, MD 06/30/21 [phone]

## 2021-06-30 NOTE — ED Triage Notes (Signed)
Pt c/o left lower back pain that radiates into the buttock since yesterday, denies injury

## 2021-08-25 DIAGNOSIS — H524 Presbyopia: Secondary | ICD-10-CM | POA: Diagnosis not present

## 2021-08-25 DIAGNOSIS — E119 Type 2 diabetes mellitus without complications: Secondary | ICD-10-CM | POA: Diagnosis not present

## 2021-11-28 DIAGNOSIS — E7800 Pure hypercholesterolemia, unspecified: Secondary | ICD-10-CM | POA: Diagnosis not present

## 2021-11-28 DIAGNOSIS — E1122 Type 2 diabetes mellitus with diabetic chronic kidney disease: Secondary | ICD-10-CM | POA: Diagnosis not present

## 2021-11-28 DIAGNOSIS — N1832 Chronic kidney disease, stage 3b: Secondary | ICD-10-CM | POA: Diagnosis not present

## 2021-11-28 DIAGNOSIS — N1831 Chronic kidney disease, stage 3a: Secondary | ICD-10-CM | POA: Diagnosis not present

## 2021-12-05 ENCOUNTER — Other Ambulatory Visit: Payer: Self-pay | Admitting: Family Medicine

## 2021-12-05 DIAGNOSIS — Z0000 Encounter for general adult medical examination without abnormal findings: Secondary | ICD-10-CM | POA: Diagnosis not present

## 2021-12-05 DIAGNOSIS — Z1211 Encounter for screening for malignant neoplasm of colon: Secondary | ICD-10-CM | POA: Diagnosis not present

## 2021-12-05 DIAGNOSIS — Z1231 Encounter for screening mammogram for malignant neoplasm of breast: Secondary | ICD-10-CM

## 2021-12-05 DIAGNOSIS — N189 Chronic kidney disease, unspecified: Secondary | ICD-10-CM | POA: Diagnosis not present

## 2021-12-05 DIAGNOSIS — E1122 Type 2 diabetes mellitus with diabetic chronic kidney disease: Secondary | ICD-10-CM | POA: Diagnosis not present

## 2021-12-05 DIAGNOSIS — Z1331 Encounter for screening for depression: Secondary | ICD-10-CM | POA: Diagnosis not present

## 2021-12-05 DIAGNOSIS — I129 Hypertensive chronic kidney disease with stage 1 through stage 4 chronic kidney disease, or unspecified chronic kidney disease: Secondary | ICD-10-CM | POA: Diagnosis not present

## 2021-12-11 ENCOUNTER — Emergency Department
Admission: EM | Admit: 2021-12-11 | Discharge: 2021-12-11 | Disposition: A | Discharge status: Home / Self Care | Payer: Medicare HMO | Attending: Emergency Medicine | Admitting: Emergency Medicine

## 2021-12-11 ENCOUNTER — Other Ambulatory Visit: Payer: Self-pay

## 2021-12-11 DIAGNOSIS — I10 Essential (primary) hypertension: Secondary | ICD-10-CM | POA: Insufficient documentation

## 2021-12-11 DIAGNOSIS — E119 Type 2 diabetes mellitus without complications: Secondary | ICD-10-CM | POA: Insufficient documentation

## 2021-12-11 DIAGNOSIS — M549 Dorsalgia, unspecified: Secondary | ICD-10-CM | POA: Diagnosis present

## 2021-12-11 DIAGNOSIS — M5431 Sciatica, right side: Secondary | ICD-10-CM | POA: Diagnosis not present

## 2021-12-11 DIAGNOSIS — M5441 Lumbago with sciatica, right side: Secondary | ICD-10-CM | POA: Diagnosis not present

## 2021-12-11 MED ORDER — PREDNISONE 10 MG (21) PO TBPK: ORAL_TABLET | ORAL | 0 refills | Status: DC

## 2021-12-11 MED ORDER — HYDROCODONE-ACETAMINOPHEN 5-325 MG PO TABS: 1.0000 | ORAL_TABLET | Freq: Four times a day (QID) | ORAL | 0 refills | Status: AC | PRN

## 2021-12-11 MED ORDER — KETOROLAC TROMETHAMINE 60 MG/2ML IM SOLN
30.0000 mg | Freq: Once | INTRAMUSCULAR | Status: AC
  Administered 2021-12-11: 30 mg via INTRAMUSCULAR
  Filled 2021-12-11: qty 2

## 2021-12-11 MED ORDER — OXYCODONE HCL 5 MG PO TABS
5.0000 mg | ORAL_TABLET | Freq: Once | ORAL | Status: AC
  Administered 2021-12-11: 5 mg via ORAL
  Filled 2021-12-11: qty 1

## 2021-12-11 NOTE — ED Provider Notes (Signed)
? ?[facility] ?Provider Note ? ? ? Event Date/Time  ? First MD Initiated Contact with Patient 12/11/21 0831   ?  (approximate) ? ? ?History  ? ?Back Pain ? ? ?HPI ? ?Yvette Crane is a 71 y.o. female with history of diabetes, gout, hypertension, and as listed in EMR presents to the emergency department for treatment and evaluation of right side back pain that radiates into the back of the right lower extremity. Symptoms started after taking care of a baby yesterday. Similar symptoms on the left side in the past.  No alleviating measures attempted prior to her arrival. ? ?  ? ? ?Physical Exam  ? ?Triage Vital Signs: ?ED Triage Vitals  ?Enc Vitals Group  ?   BP 12/11/21 0741 (!) 156/128  ?   Pulse Rate 12/11/21 0741 99  ?   Resp 12/11/21 0741 18  ?   Temp 12/11/21 0741 98.6 ?F (37 ?C)  ?   Temp Source 12/11/21 0741 Oral  ?   SpO2 12/11/21 0741 99 %  ?   Weight 12/11/21 0825 216 lb 14.9 oz (98.4 kg)  ?   Height 12/11/21 0825 '5\' 4"'$  (1.626 m)  ?   Head Circumference --   ?   Peak Flow --   ?   Pain Score 12/11/21 0735 10  ?   Pain Loc --   ?   Pain Edu? --   ?   Excl. in [place]? --   ? ? ?Most recent vital signs: ?Vitals:  ? 12/11/21 0741  ?BP: (!) 156/128  ?Pulse: 99  ?Resp: 18  ?Temp: 98.6 ?F (37 ?C)  ?SpO2: 99%  ? ? ?General: Awake, no distress.  ?CV:  Good peripheral perfusion.  ?Resp:  Normal effort.  ?Abd:  No distention.  ?Other:  Positive straight leg raise on the right side.  No focal midline tenderness of the lumbar spine. ? ? ?ED Results / Procedures / Treatments  ? ?Labs ?(all labs ordered are listed, but only abnormal results are displayed) ?Labs Reviewed - No data to display ? ? ?EKG ? ?Not indicated ? ? ?RADIOLOGY ? ?Image and radiology report reviewed by me. ? ?Not indicated ? ?PROCEDURES: ? ?Critical Care performed: No ? ?Procedures ? ? ?MEDICATIONS ORDERED IN ED: ?Medications  ?oxyCODONE (Oxy IR/ROXICODONE) immediate release tablet 5 mg (5 mg Oral Given 12/11/21 0938)  ?ketorolac  (TORADOL) injection 30 mg (30 mg Intramuscular Given 12/11/21 0938)  ? ? ? ?IMPRESSION / MDM / ASSESSMENT AND PLAN / ED COURSE  ? ?I have reviewed the triage note. ? ?Differential diagnosis includes, but is not limited to: Lumbosacral strain, sciatica ? ?71 year old female presenting to the emergency department for treatment and evaluation of right side back pain that goes into the posterior aspect of her thigh.  Symptoms started after taking care of of a grandbaby yesterday. ? ?Patient has no red flags of back pain.  She has no urinary symptoms.  Plan will be to give her a Toradol injection and Roxicodone and reassess. ? ?----------------------------------------- ?10:13 AM on 12/11/2021 ?----------------------------------------- ?Patient feeling some better and feels that she is ready for discharge. Old ER record shows that she was treated with steroid and pain meds last time she had sciatica. She tells me these worked well and denies any adverse effect. Will treat with the same medications today.  Patient is to follow-up with her primary care provider if not improving over the week.  If symptoms change or worsen  and she is unable to schedule an appointment, she is to return to the emergency department. ? ?  ? ? ?FINAL CLINICAL IMPRESSION(S) / ED DIAGNOSES  ? ?Final diagnoses:  ?Sciatica of right side  ? ? ? ?Rx / DC Orders  ? ?ED Discharge Orders   ? ?      Ordered  ?  predniSONE (STERAPRED UNI-PAK 21 TAB) 10 MG (21) TBPK tablet       ? 12/11/21 1017  ?  HYDROcodone-acetaminophen (NORCO/VICODIN) 5-325 MG tablet  Every 6 hours PRN       ? 12/11/21 1017  ? ?  ?  ? ?  ? ? ? ?Note:  This document was prepared using Dragon voice recognition software and may include unintentional dictation errors. ?  ?Victorino Dike, FNP ?12/11/21 1018 ? ?  ?Naaman Plummer, MD ?12/11/21 1629 ? ?

## 2021-12-11 NOTE — Discharge Instructions (Signed)
Please follow-up with your primary care provider if you are not feeling better over the week. ? ?Return to the emergency department for symptoms of change or worsen if you are unable to schedule an appointment. ?

## 2021-12-11 NOTE — ED Triage Notes (Signed)
Pt c/o lower back pain that radiates into the leg since yesterday ?

## 2021-12-11 NOTE — ED Notes (Signed)
See triage note  presents with lower back pain which started yesterday  pain radiates into right leg  denies any fall   increased pain with standing ?

## 2022-01-14 ENCOUNTER — Ambulatory Visit
Admission: RE | Admit: 2022-01-14 | Discharge: 2022-01-14 | Disposition: A | Discharge status: Home / Self Care | Payer: Medicare HMO | Source: Ambulatory Visit | Attending: Family Medicine | Admitting: Family Medicine

## 2022-01-14 DIAGNOSIS — Z1231 Encounter for screening mammogram for malignant neoplasm of breast: Secondary | ICD-10-CM | POA: Diagnosis not present

## 2022-03-15 ENCOUNTER — Inpatient Hospital Stay
Admission: EM | Admit: 2022-03-15 | Discharge: 2022-03-17 | DRG: 872 | Disposition: A | Discharge status: Home / Self Care | Payer: Medicare HMO | Attending: Internal Medicine | Admitting: Internal Medicine

## 2022-03-15 ENCOUNTER — Emergency Department: Payer: Medicare HMO

## 2022-03-15 ENCOUNTER — Encounter: Payer: Self-pay | Admitting: Emergency Medicine

## 2022-03-15 ENCOUNTER — Other Ambulatory Visit: Payer: Self-pay

## 2022-03-15 DIAGNOSIS — E669 Obesity, unspecified: Secondary | ICD-10-CM | POA: Diagnosis present

## 2022-03-15 DIAGNOSIS — N183 Chronic kidney disease, stage 3 unspecified: Secondary | ICD-10-CM | POA: Diagnosis not present

## 2022-03-15 DIAGNOSIS — Z7984 Long term (current) use of oral hypoglycemic drugs: Secondary | ICD-10-CM | POA: Diagnosis not present

## 2022-03-15 DIAGNOSIS — A419 Sepsis, unspecified organism: Secondary | ICD-10-CM | POA: Diagnosis present

## 2022-03-15 DIAGNOSIS — Z79899 Other long term (current) drug therapy: Secondary | ICD-10-CM

## 2022-03-15 DIAGNOSIS — Z87891 Personal history of nicotine dependence: Secondary | ICD-10-CM

## 2022-03-15 DIAGNOSIS — R109 Unspecified abdominal pain: Secondary | ICD-10-CM | POA: Diagnosis not present

## 2022-03-15 DIAGNOSIS — E785 Hyperlipidemia, unspecified: Secondary | ICD-10-CM | POA: Diagnosis present

## 2022-03-15 DIAGNOSIS — K5900 Constipation, unspecified: Secondary | ICD-10-CM | POA: Diagnosis not present

## 2022-03-15 DIAGNOSIS — R319 Hematuria, unspecified: Secondary | ICD-10-CM | POA: Diagnosis not present

## 2022-03-15 DIAGNOSIS — I129 Hypertensive chronic kidney disease with stage 1 through stage 4 chronic kidney disease, or unspecified chronic kidney disease: Secondary | ICD-10-CM | POA: Diagnosis present

## 2022-03-15 DIAGNOSIS — N3091 Cystitis, unspecified with hematuria: Secondary | ICD-10-CM | POA: Diagnosis not present

## 2022-03-15 DIAGNOSIS — M109 Gout, unspecified: Secondary | ICD-10-CM | POA: Diagnosis present

## 2022-03-15 DIAGNOSIS — N390 Urinary tract infection, site not specified: Secondary | ICD-10-CM

## 2022-03-15 DIAGNOSIS — Z6837 Body mass index (BMI) 37.0-37.9, adult: Secondary | ICD-10-CM | POA: Diagnosis not present

## 2022-03-15 DIAGNOSIS — Z7982 Long term (current) use of aspirin: Secondary | ICD-10-CM | POA: Diagnosis not present

## 2022-03-15 DIAGNOSIS — Z90710 Acquired absence of both cervix and uterus: Secondary | ICD-10-CM | POA: Diagnosis not present

## 2022-03-15 DIAGNOSIS — E872 Acidosis, unspecified: Secondary | ICD-10-CM | POA: Diagnosis not present

## 2022-03-15 DIAGNOSIS — A4151 Sepsis due to Escherichia coli [E. coli]: Principal | ICD-10-CM | POA: Diagnosis present

## 2022-03-15 DIAGNOSIS — N3090 Cystitis, unspecified without hematuria: Secondary | ICD-10-CM | POA: Diagnosis not present

## 2022-03-15 DIAGNOSIS — M1A9XX0 Chronic gout, unspecified, without tophus (tophi): Secondary | ICD-10-CM | POA: Diagnosis not present

## 2022-03-15 DIAGNOSIS — I10 Essential (primary) hypertension: Secondary | ICD-10-CM | POA: Diagnosis present

## 2022-03-15 DIAGNOSIS — E1122 Type 2 diabetes mellitus with diabetic chronic kidney disease: Secondary | ICD-10-CM | POA: Diagnosis not present

## 2022-03-15 DIAGNOSIS — I159 Secondary hypertension, unspecified: Secondary | ICD-10-CM | POA: Diagnosis not present

## 2022-03-15 DIAGNOSIS — R6520 Severe sepsis without septic shock: Secondary | ICD-10-CM | POA: Diagnosis present

## 2022-03-15 LAB — URINALYSIS, ROUTINE W REFLEX MICROSCOPIC
RBC / HPF: >50 RBC/hpf — ABNORMAL HIGH (ref 0–5)
Specific Gravity, Urine: 1.01 (ref 1.005–1.030)
Squamous Epithelial / HPF: NONE SEEN (ref 0–5)
WBC, UA: >50 WBC/hpf — ABNORMAL HIGH (ref 0–5)

## 2022-03-15 LAB — LACTIC ACID, PLASMA
Lactic Acid, Venous: 2.9 mmol/L (ref 0.5–1.9)
Lactic Acid, Venous: 2.3 mmol/L (ref 0.5–1.9)
Lactic Acid, Venous: 1.7 mmol/L (ref 0.5–1.9)
Lactic Acid, Venous: 2 mmol/L (ref 0.5–1.9)

## 2022-03-15 LAB — COMPREHENSIVE METABOLIC PANEL
ALT: 8 U/L (ref 0–44)
AST: 28 U/L (ref 15–41)
Albumin: 3.9 g/dL (ref 3.5–5.0)
Alkaline Phosphatase: 87 U/L (ref 38–126)
Anion gap: 11 (ref 5–15)
BUN: 25 mg/dL — ABNORMAL HIGH (ref 8–23)
CO2: 24 mmol/L (ref 22–32)
Calcium: 9.7 mg/dL (ref 8.9–10.3)
Chloride: 100 mmol/L (ref 98–111)
Creatinine, Ser: 1.82 mg/dL — ABNORMAL HIGH (ref 0.44–1.00)
GFR, Estimated: 29 mL/min — ABNORMAL LOW (ref >60)
Glucose, Bld: 167 mg/dL — ABNORMAL HIGH (ref 70–99)
Potassium: 4.5 mmol/L (ref 3.5–5.1)
Sodium: 135 mmol/L (ref 135–145)
Total Bilirubin: 0.8 mg/dL (ref 0.3–1.2)
Total Protein: 7.9 g/dL (ref 6.5–8.1)

## 2022-03-15 LAB — HEMOGLOBIN A1C
Hgb A1c MFr Bld: 6 % — ABNORMAL HIGH (ref 4.8–5.6)
Mean Plasma Glucose: 125.5 mg/dL

## 2022-03-15 LAB — CBC
HCT: 34.1 % — ABNORMAL LOW (ref 36.0–46.0)
Hemoglobin: 10.6 g/dL — ABNORMAL LOW (ref 12.0–15.0)
MCH: 30.2 pg (ref 26.0–34.0)
MCHC: 31.1 g/dL (ref 30.0–36.0)
MCV: 97.2 fL (ref 80.0–100.0)
Platelets: 387 10*3/uL (ref 150–400)
RBC: 3.51 MIL/uL — ABNORMAL LOW (ref 3.87–5.11)
RDW: 12.9 % (ref 11.5–15.5)
WBC: 21.8 10*3/uL — ABNORMAL HIGH (ref 4.0–10.5)
nRBC: 0 % (ref 0.0–0.2)

## 2022-03-15 LAB — LIPASE, BLOOD: Lipase: 48 U/L (ref 11–51)

## 2022-03-15 LAB — GLUCOSE, CAPILLARY
Glucose-Capillary: 113 mg/dL — ABNORMAL HIGH (ref 70–99)
Glucose-Capillary: 81 mg/dL (ref 70–99)
Glucose-Capillary: 102 mg/dL — ABNORMAL HIGH (ref 70–99)

## 2022-03-15 MED ORDER — INSULIN ASPART 100 UNIT/ML IJ SOLN
0.0000 [IU] | Freq: Three times a day (TID) | INTRAMUSCULAR | Status: DC
  Administered 2022-03-17: 3 [IU] via SUBCUTANEOUS
  Filled 2022-03-15: qty 1

## 2022-03-15 MED ORDER — LACTATED RINGERS IV BOLUS
1000.0000 mL | Freq: Once | INTRAVENOUS | Status: AC
  Administered 2022-03-15: 1000 mL via INTRAVENOUS

## 2022-03-15 MED ORDER — OMEGA-3-ACID ETHYL ESTERS 1 G PO CAPS
1.0000 | ORAL_CAPSULE | Freq: Two times a day (BID) | ORAL | Status: DC
  Administered 2022-03-15 – 2022-03-17 (×4): 1 g via ORAL
  Filled 2022-03-15 (×4): qty 1

## 2022-03-15 MED ORDER — LACTATED RINGERS IV SOLN: INTRAVENOUS | Status: DC

## 2022-03-15 MED ORDER — SODIUM CHLORIDE 0.9 % IV SOLN: 2.0000 g | INTRAVENOUS | Status: DC

## 2022-03-15 MED ORDER — ONDANSETRON HCL 4 MG/2ML IJ SOLN: 4.0000 mg | Freq: Four times a day (QID) | INTRAMUSCULAR | Status: DC | PRN

## 2022-03-15 MED ORDER — ALLOPURINOL 100 MG PO TABS
100.0000 mg | ORAL_TABLET | Freq: Every day | ORAL | Status: DC
  Administered 2022-03-16 – 2022-03-17 (×2): 100 mg via ORAL
  Filled 2022-03-15 (×4): qty 1

## 2022-03-15 MED ORDER — ACETAMINOPHEN 325 MG PO TABS: 650.0000 mg | ORAL_TABLET | Freq: Four times a day (QID) | ORAL | Status: DC | PRN

## 2022-03-15 MED ORDER — ONDANSETRON HCL 4 MG PO TABS: 4.0000 mg | ORAL_TABLET | Freq: Four times a day (QID) | ORAL | Status: DC | PRN

## 2022-03-15 MED ORDER — SODIUM CHLORIDE 0.9 % IV BOLUS
1000.0000 mL | Freq: Once | INTRAVENOUS | Status: AC
  Administered 2022-03-15: 1000 mL via INTRAVENOUS

## 2022-03-15 MED ORDER — ALBUTEROL SULFATE (2.5 MG/3ML) 0.083% IN NEBU: 2.5000 mg | INHALATION_SOLUTION | Freq: Four times a day (QID) | RESPIRATORY_TRACT | Status: DC | PRN

## 2022-03-15 MED ORDER — SODIUM CHLORIDE 0.9 % IV SOLN
2.0000 g | INTRAVENOUS | Status: DC
  Administered 2022-03-15 – 2022-03-16 (×2): 2 g via INTRAVENOUS
  Filled 2022-03-15 (×3): qty 20

## 2022-03-15 NOTE — ED Triage Notes (Signed)
Pt reports for the last month has had intermittent abd pain, urinary issues and this am started with blood in her urine, enough that she could see it clearly.

## 2022-03-15 NOTE — H&P (Signed)
History and Physical    Patient: Yvette Crane:[id number] DOB: 1951/02/16 DOA: 03/15/2022 DOS: the patient was seen and examined on 03/15/2022 PCP: Derinda Late, MD  Patient coming from: Home  Chief Complaint:  Chief Complaint  Patient presents with   Hematuria   Urinary Frequency   Abdominal Pain   HPI: Yvette Crane is a 71 y.o. female with medical history significant for diabetes mellitus, hypertension, history of gout who presents to the ER for evaluation of a 1 month history of intermittent suprapubic discomfort associated with urgency and frequency and then hematuria on the day of admission. Patient describes gross hematuria without any clots.  She denies having any fever or chills and is not on a blood thinner. She was concerned about the hematuria prompting her visit to the ER. Does not have a history of renal stones. She denies having any fever, no chills, no headache, no dizziness, no lightheadedness, no nausea, no vomiting, no blurred vision or focal deficits, no changes in her bowel habits,  Labs reveal pyuria as well as marked leukocytosis     Review of Systems: As mentioned in the history of present illness. All other systems reviewed and are negative. Past Medical History:  Diagnosis Date   Diabetes mellitus    Gout    Hyperlipidemia    Hypertension    Past Surgical History:  Procedure Laterality Date   ABDOMINAL HYSTERECTOMY     TUBAL LIGATION     Social History:  reports that she quit smoking about 41 years ago. She has never used smokeless tobacco. She reports that she does not drink alcohol and does not use drugs.  No Known Allergies  Family History  Problem Relation Age of Onset   Breast cancer Neg Hx     Prior to Admission medications   Medication Sig Start Date End Date Taking? Authorizing Provider  acetaminophen (TYLENOL) 325 MG tablet Take 2 tablets (650 mg total) by mouth every 6 (six) hours as needed for mild pain (or Fever >/= 101).  10/26/18   Vaughan Basta, MD  albuterol (PROVENTIL HFA;VENTOLIN HFA) 108 (90 Base) MCG/ACT inhaler Inhale 2 puffs into the lungs every 6 (six) hours as needed for wheezing or shortness of breath. 10/26/18   Vaughan Basta, MD  allopurinol (ZYLOPRIM) 100 MG tablet Take 100 mg by mouth daily. 07/04/18   [provider]  aspirin 81 MG tablet Take 81 mg by mouth daily.      [provider]  atorvastatin (LIPITOR) 10 MG tablet Take 10 mg by mouth daily.      [provider]  colchicine 0.6 MG tablet Take 1 tablet (0.6 mg total) by mouth 2 (two) times daily as needed (pain). 05/02/17 05/17/17  Gregor Hams, MD  etodolac (LODINE) 300 MG capsule Take 1 capsule (300 mg total) by mouth 2 (two) times daily. 10/13/16   Sable Feil, PA-C  losartan (COZAAR) 100 MG tablet Take 100 mg by mouth daily.      [provider]  meclizine (ANTIVERT) 32 MG tablet Take 32 mg by mouth as needed.      [provider]  metFORMIN (GLUCOPHAGE) 1000 MG tablet Take 1,000 mg by mouth 2 (two) times daily. 09/26/18   [provider]  nystatin cream (MYCOSTATIN) Apply 1 application topically 2 (two) times daily. 12/16/15   [provider]  omega-3 acid ethyl esters (LOVAZA) 1 G capsule Take 2 g by mouth 2 (two) times daily.  [provider]  Omega-3 Fatty Acids (OMEGA-3 EPA FISH OIL PO) Take 2 capsules by mouth 2 (two) times daily.    [provider]  predniSONE (STERAPRED UNI-PAK 21 TAB) 10 MG (21) TBPK tablet Take 6 tablets on the first day and decrease by 1 tablet each day until finished. 12/11/21   Victorino Dike, FNP    Physical Exam: Vitals:   03/15/22 0901 03/15/22 0909 03/15/22 1100 03/15/22 1130  BP:  131/76 118/73 (!) 133/90  Pulse:  (!) 107 84 88  Resp:  16    Temp:  99 F (37.2 C)    TempSrc:  Oral    SpO2:  99% 100% 100%  Weight: 98 kg     Height: 5' 4"  (1.626 m)      Physical Exam Vitals and nursing note  reviewed.  Constitutional:      Appearance: She is obese.  HENT:     Head: Normocephalic and atraumatic.     Mouth/Throat:     Mouth: Mucous membranes are moist.  Eyes:     Pupils: Pupils are equal, round, and reactive to light.     Comments: Pale conjunctiva  Cardiovascular:     Rate and Rhythm: Tachycardia present.  Pulmonary:     Effort: Pulmonary effort is normal.     Breath sounds: Normal breath sounds.  Abdominal:     General: Abdomen is flat and scaphoid. Bowel sounds are normal.     Palpations: Abdomen is soft.     Tenderness: There is abdominal tenderness in the suprapubic area.  Skin:    General: Skin is warm and dry.  Neurological:     General: No focal deficit present.     Mental Status: She is alert.  Psychiatric:        Mood and Affect: Mood normal.        Behavior: Behavior normal.     Data Reviewed: Relevant notes from primary care and specialist visits, past discharge summaries as available in EHR, including Care Everywhere. Prior diagnostic testing as pertinent to current admission diagnoses Updated medications and problem lists for reconciliation ED course, including vitals, labs, imaging, treatment and response to treatment Triage notes, nursing and pharmacy notes and ED provider's notes Notable results as noted in HPI Labs reviewed.  Lactic acid 2.9, lipase 48, sodium 135, potassium 4.5, chloride 100, bicarb 24, glucose 169, BUN 25, creatinine 1.82, calcium 9.7, total protein 7.9, albumin 3.9, AST 28, ALT 8, alk phos 87, white count 21.8, hemoglobin 10.6, hematocrit 34.1, platelet count 387 CT scan of abdomen and pelvis contrast shows no acute findings in the abdomen or pelvis. Specifically, no findings to explain the patient's history of hematuria. There are no new results to review at this time.  Assessment and Plan: * Sepsis secondary to UTI [facility]) As evidenced by tachycardia, marked leukocytosis as well as lactic acidosis and pyuria (with concern for  hemorrhagic cystitis) Trend lactic acid levels Continue aggressive IV fluid resuscitation Treat patient empirically with Rocephin 2 g IV daily until blood and urine culture results become available  CKD stage 3 due to type 2 diabetes mellitus ([name]) Patient has type 2 diabetes mellitus on oral agents to be placed on hold during this hospitalization. She has stage III chronic kidney disease related to her underlying diabetes mellitus and renal function appears stable Monitor closely during this hospitalization  Hypertension Blood pressure is  stable Hold antihypertensive medications for now  GOUT Stable Continue allopurinol  Advance Care Planning:   Code Status: Full Code   Consults: None  Family Communication: Greater than 50% of time was spent discussing patient's condition and plan of with her at the bedside.  All questions and concerns have been addressed.  She verbalizes understanding and agrees with plan.  Severity of Illness: The appropriate patient status for this patient is INPATIENT. Inpatient status is judged to be reasonable and necessary in order to provide the required intensity of service to ensure the patient's safety. The patient's presenting symptoms, physical exam findings, and initial radiographic and laboratory data in the context of their chronic comorbidities is felt to place them at high risk for further clinical deterioration. Furthermore, it is not anticipated that the patient will be medically stable for discharge from the hospital within 2 midnights of admission.   * I certify that at the point of admission it is my clinical judgment that the patient will require inpatient hospital care spanning beyond 2 midnights from the point of admission due to high intensity of service, high risk for further deterioration and high frequency of surveillance required.*  Author: Collier Bullock, MD 03/15/2022 12:08 PM  For on call review [email or web address].

## 2022-03-15 NOTE — Consult Note (Signed)
CODE SEPSIS - PHARMACY COMMUNICATION  **Broad Spectrum Antibiotics should be administered within 1 hour of Sepsis diagnosis**  Time Code Sepsis Called/Page Received: 10:16  Antibiotics Ordered: ceftriaxone  Time of 1st antibiotic administration: 10:55  Additional action taken by pharmacy: none  Gretel Acre, PharmD PGY1 Pharmacy Resident 03/15/2022 10:22 AM

## 2022-03-15 NOTE — ED Provider Notes (Addendum)
[facility] Provider Note    Event Date/Time   First MD Initiated Contact with Patient 03/15/22 [phone]     (approximate)   History   Hematuria, Urinary Frequency, and Abdominal Pain   HPI  Yvette Crane is a 71 y.o. female with diabetes, hypertension who comes in with concerns for hematuria urinary frequency and abdominal pain.  Patient reports noticing this morning some blood in her urine and some burning when she urinates.  She does report a little bit of suprapubic pain.  She reports a little bit of back pain over the past few weeks.  She denies any chest pain, shortness of breath or other concerns.  She is on a baby aspirin only.  She denies any clots, no the urine   Physical Exam   Triage Vital Signs: ED Triage Vitals [03/15/22 0901]  Enc Vitals Group     BP      Pulse      Resp      Temp      Temp src      SpO2      Weight 216 lb 0.8 oz (98 kg)     Height '5\' 4"'$  (1.626 m)     Head Circumference      Peak Flow      Pain Score 6     Pain Loc      Pain Edu?      Excl. in [place]?     Most recent vital signs: Vitals:   03/15/22 0909  BP: 131/76  Pulse: (!) 107  Resp: 16  Temp: 99 F (37.2 C)  SpO2: 99%     General: Awake, no distress.  CV:  Good peripheral perfusion.  Resp:  Normal effort.  Abd:  No distention.  Some lower abdominal tenderness. Other:     ED Results / Procedures / Treatments   Labs (all labs ordered are listed, but only abnormal results are displayed) Labs Reviewed  URINE CULTURE  LIPASE, BLOOD  COMPREHENSIVE METABOLIC PANEL  CBC  URINALYSIS, ROUTINE W REFLEX MICROSCOPIC        RADIOLOGY I have reviewed the CT personally and interpreted and bladder is decompressed without obstructed kidney stone   PROCEDURES:  Critical Care performed: Yes, see critical care procedure note(s)  .Critical Care  Performed by: Vanessa [place], MD Authorized by: Vanessa [place], MD   Critical care provider statement:     Critical care time (minutes):  30   Critical care was necessary to treat or prevent imminent or life-threatening deterioration of the following conditions:  Sepsis   Critical care was time spent personally by me on the following activities:  Development of treatment plan with patient or surrogate, discussions with consultants, evaluation of patient's response to treatment, examination of patient, ordering and review of laboratory studies, ordering and review of radiographic studies, ordering and performing treatments and interventions, pulse oximetry, re-evaluation of patient's condition and review of old charts    [place] ED: Medications - No data to display   IMPRESSION / MDM / [place] / ED COURSE  I reviewed the triage vital signs and the nursing notes.   Patient's presentation is most consistent with acute presentation with potential threat to life or bodily function.   Differential includes UTI, kidney stone, abdominal aneurysm, abdominal infection.  Given she does report some lower abdominal discomfort we will do the CT with contrast to make sure is no evidence of vascular abnormality  or infectious process as well as evaluate for any evidence of kidney stone.  Patient slightly tachycardic so patient given some fluids.  Will get bladder scan to make sure no retention but I was able to see her urine and it was blood tinged but there were no clots noted in it.    10:06 AM patient's white count came back elevated.  Patient meets sepsis criteria with elevated white count and elevated heart rate therefore sepsis alert called.  Most likely source UTI.  We will give a dose of ceftriaxone.  CMP shows creatinine that is slightly higher than baseline but urine looks consistent with UTI/sending for culture.   CT imaging is reassuring but given patient meets sepsis criteria we discussed admission given patient's age for hemorrhagic cystitis.  Patient expressed understanding  and felt comfortable with this plan  Patient's heart rates have normalized after 1 L of fluid and given patient's age will hold off on additional fluid unless becomes tachycardic or hypotensive but given lactate elevated will give another 1L  The patient is on the cardiac monitor to evaluate for evidence of arrhythmia and/or significant heart rate changes.      FINAL CLINICAL IMPRESSION(S) / ED DIAGNOSES   Final diagnoses:  Hemorrhagic cystitis  Sepsis, due to unspecified organism, unspecified whether acute organ dysfunction present [facility])     Rx / DC Orders   ED Discharge Orders     None        Note:  This document was prepared using Dragon voice recognition software and may include unintentional dictation errors.   Vanessa [place], MD 03/15/22 1118    Vanessa [place], MD 03/15/22 1118    Vanessa , MD 03/15/22 [phone]

## 2022-03-15 NOTE — Assessment & Plan Note (Addendum)
Stable and at baseline  Lab Results  Component Value Date   CREATININE 1.52 (H) 03/16/2022   CREATININE 1.82 (H) 03/15/2022   CREATININE 1.64 (H) 10/24/2018   Sliding scale insulin.  Hemoglobin A1c 6

## 2022-03-15 NOTE — ED Notes (Signed)
Patient given warm blanket by August Luz

## 2022-03-15 NOTE — Sepsis Progress Note (Signed)
Elink monitoring code sepsis  1230- message sent to bedside RN to clarify if blood cultures were drawn prior to abx since the time was only minutes apart for each other. It was clarified blood cultures were drawn prior to abx started.

## 2022-03-15 NOTE — Assessment & Plan Note (Addendum)
As evidenced by tachycardia, marked leukocytosis as well as lactic acidosis and pyuria (with concern for hemorrhagic cystitis) lactic acid levels normalized now. Continue IV fluid resuscitation Continue empiric Rocephin 2 g IV daily until blood and urine culture results become available

## 2022-03-15 NOTE — Assessment & Plan Note (Addendum)
Blood pressure is  Stable. Hold antihypertensive medications for now

## 2022-03-15 NOTE — Plan of Care (Signed)

## 2022-03-15 NOTE — ED Notes (Signed)
Bladder scan 0mL.

## 2022-03-15 NOTE — Assessment & Plan Note (Addendum)
Continue allopurinol 

## 2022-03-16 DIAGNOSIS — I159 Secondary hypertension, unspecified: Secondary | ICD-10-CM

## 2022-03-16 DIAGNOSIS — E1122 Type 2 diabetes mellitus with diabetic chronic kidney disease: Secondary | ICD-10-CM | POA: Diagnosis not present

## 2022-03-16 DIAGNOSIS — A419 Sepsis, unspecified organism: Secondary | ICD-10-CM | POA: Diagnosis not present

## 2022-03-16 DIAGNOSIS — M1A9XX0 Chronic gout, unspecified, without tophus (tophi): Secondary | ICD-10-CM | POA: Diagnosis not present

## 2022-03-16 DIAGNOSIS — N183 Chronic kidney disease, stage 3 unspecified: Secondary | ICD-10-CM

## 2022-03-16 LAB — CBC
HCT: 26.8 % — ABNORMAL LOW (ref 36.0–46.0)
Hemoglobin: 8.6 g/dL — ABNORMAL LOW (ref 12.0–15.0)
MCH: 30.9 pg (ref 26.0–34.0)
MCHC: 32.1 g/dL (ref 30.0–36.0)
MCV: 96.4 fL (ref 80.0–100.0)
Platelets: 305 10*3/uL (ref 150–400)
RBC: 2.78 MIL/uL — ABNORMAL LOW (ref 3.87–5.11)
RDW: 12.8 % (ref 11.5–15.5)
WBC: 13.5 10*3/uL — ABNORMAL HIGH (ref 4.0–10.5)
nRBC: 0 % (ref 0.0–0.2)

## 2022-03-16 LAB — GLUCOSE, CAPILLARY
Glucose-Capillary: 89 mg/dL (ref 70–99)
Glucose-Capillary: 120 mg/dL — ABNORMAL HIGH (ref 70–99)
Glucose-Capillary: 117 mg/dL — ABNORMAL HIGH (ref 70–99)
Glucose-Capillary: 125 mg/dL — ABNORMAL HIGH (ref 70–99)

## 2022-03-16 LAB — LACTIC ACID, PLASMA
Lactic Acid, Venous: 0.9 mmol/L (ref 0.5–1.9)
Lactic Acid, Venous: 1.3 mmol/L (ref 0.5–1.9)

## 2022-03-16 LAB — BASIC METABOLIC PANEL
Anion gap: 9 (ref 5–15)
BUN: 19 mg/dL (ref 8–23)
CO2: 24 mmol/L (ref 22–32)
Calcium: 8.9 mg/dL (ref 8.9–10.3)
Chloride: 106 mmol/L (ref 98–111)
Creatinine, Ser: 1.52 mg/dL — ABNORMAL HIGH (ref 0.44–1.00)
GFR, Estimated: 36 mL/min — ABNORMAL LOW (ref >60)
Glucose, Bld: 126 mg/dL — ABNORMAL HIGH (ref 70–99)
Potassium: 4.1 mmol/L (ref 3.5–5.1)
Sodium: 139 mmol/L (ref 135–145)

## 2022-03-16 LAB — CORTISOL-AM, BLOOD: Cortisol - AM: 5.9 ug/dL — ABNORMAL LOW (ref 6.7–22.6)

## 2022-03-16 LAB — PROTIME-INR
INR: 1 (ref 0.8–1.2)
Prothrombin Time: 12.9 seconds (ref 11.4–15.2)

## 2022-03-16 LAB — PROCALCITONIN: Procalcitonin: <0.1 ng/mL

## 2022-03-16 MED ORDER — LORATADINE 10 MG PO TABS
10.0000 mg | ORAL_TABLET | Freq: Once | ORAL | Status: AC
  Administered 2022-03-16: 10 mg via ORAL
  Filled 2022-03-16: qty 1

## 2022-03-16 MED ORDER — SENNOSIDES-DOCUSATE SODIUM 8.6-50 MG PO TABS
2.0000 | ORAL_TABLET | Freq: Two times a day (BID) | ORAL | Status: DC
  Administered 2022-03-16 – 2022-03-17 (×3): 2 via ORAL
  Filled 2022-03-16 (×3): qty 2

## 2022-03-16 MED ORDER — POLYETHYLENE GLYCOL 3350 17 G PO PACK
17.0000 g | PACK | Freq: Every day | ORAL | Status: DC
  Administered 2022-03-16 – 2022-03-17 (×2): 17 g via ORAL
  Filled 2022-03-16 (×2): qty 1

## 2022-03-16 MED ORDER — PRAVASTATIN SODIUM 20 MG PO TABS
20.0000 mg | ORAL_TABLET | Freq: Every day | ORAL | Status: DC
  Administered 2022-03-16 – 2022-03-17 (×2): 20 mg via ORAL
  Filled 2022-03-16 (×2): qty 1

## 2022-03-16 NOTE — Plan of Care (Signed)
  Problem: Fluid Volume: Goal: Hemodynamic stability will improve Outcome: Progressing   Problem: Clinical Measurements: Goal: Diagnostic test results will improve Outcome: Progressing   Problem: Clinical Measurements: Goal: Signs and symptoms of infection will decrease Outcome: Progressing   Problem: Respiratory: Goal: Ability to maintain adequate ventilation will improve Outcome: Progressing   Problem: Education: Goal: Knowledge of General Education information will improve Description: Including pain rating scale, medication(s)/side effects and non-pharmacologic comfort measures Outcome: Progressing   Problem: Health Behavior/Discharge Planning: Goal: Ability to manage health-related needs will improve Outcome: Progressing   Problem: Clinical Measurements: Goal: Ability to maintain clinical measurements within normal limits will improve Outcome: Progressing   Problem: Clinical Measurements: Goal: Will remain free from infection Outcome: Progressing   Problem: Clinical Measurements: Goal: Diagnostic test results will improve Outcome: Progressing   Problem: Clinical Measurements: Goal: Respiratory complications will improve Outcome: Progressing   Problem: Clinical Measurements: Goal: Cardiovascular complication will be avoided Outcome: Progressing   Problem: Clinical Measurements: Goal: Cardiovascular complication will be avoided Outcome: Progressing   Problem: Activity: Goal: Risk for activity intolerance will decrease Outcome: Progressing   Problem: Nutrition: Goal: Adequate nutrition will be maintained Outcome: Progressing   Problem: Coping: Goal: Level of anxiety will decrease Outcome: Progressing   Problem: Elimination: Goal: Will not experience complications related to bowel motility Outcome: Progressing   Problem: Elimination: Goal: Will not experience complications related to urinary retention Outcome: Progressing   Problem: Pain  Managment: Goal: General experience of comfort will improve Outcome: Progressing   Problem: Safety: Goal: Ability to remain free from injury will improve Outcome: Progressing   Problem: Skin Integrity: Goal: Risk for impaired skin integrity will decrease Outcome: Progressing   Problem: Education: Goal: Ability to describe self-care measures that may prevent or decrease complications (Diabetes Survival Skills Education) will improve Outcome: Progressing   Problem: Coping: Goal: Ability to adjust to condition or change in health will improve Outcome: Progressing   Problem: Health Behavior/Discharge Planning: Goal: Ability to identify and utilize available resources and services will improve Outcome: Progressing   Problem: Health Behavior/Discharge Planning: Goal: Ability to manage health-related needs will improve Outcome: Progressing   Problem: Metabolic: Goal: Ability to maintain appropriate glucose levels will improve Outcome: Progressing   Problem: Nutritional: Goal: Maintenance of adequate nutrition will improve Outcome: Progressing   Problem: Skin Integrity: Goal: Risk for impaired skin integrity will decrease Outcome: Progressing   Problem: Tissue Perfusion: Goal: Adequacy of tissue perfusion will improve Outcome: Progressing

## 2022-03-16 NOTE — Progress Notes (Signed)
  Progress Note   Patient: Yvette Crane HGD:[id number] DOB: February 07, 1951 DOA: 03/15/2022     1 DOS: the patient was seen and examined on 03/16/2022   Brief hospital course: 71 year old female with a history of diabetes, hypertension, gout is admitted for sepsis due to UTI  8/7: IV antibiotics, ambulation, DC telemetry and resume home medications   Assessment and Plan: * Sepsis secondary to UTI ([place]) As evidenced by tachycardia, marked leukocytosis as well as lactic acidosis and pyuria (with concern for hemorrhagic cystitis) lactic acid levels normalized now. Continue IV fluid resuscitation Continue empiric Rocephin 2 g IV daily until blood and urine culture results become available  CKD stage 3 due to type 2 diabetes mellitus ([place]) Stable and at baseline  Lab Results  Component Value Date   CREATININE 1.52 (H) 03/16/2022   CREATININE 1.82 (H) 03/15/2022   CREATININE 1.64 (H) 10/24/2018   Sliding scale insulin.  Hemoglobin A1c 6  Hypertension Blood pressure is  Stable. Hold antihypertensive medications for now  GOUT Continue allopurinol        Subjective: Feels constipated.  Overall feeling better  Physical Exam: Vitals:   03/15/22 1537 03/15/22 2112 03/16/22 0332 03/16/22 0720  BP: 111/73 134/69 132/65 133/82  Pulse: 86 89 97 85  Resp: '16 18 16 16  '$ Temp: 98.2 F (36.8 C) 97.9 F (36.6 C) 98.9 F (37.2 C) 98.4 F (36.9 C)  TempSrc:  Oral Oral Oral  SpO2: 99% 97% 100% 99%  Weight:      Height:       71 year old female lying in the bed comfortably without any acute distress Lungs clear to auscultation bilaterally Cardiovascular regular rate and rhythm Abdomen soft, benign except mild suprapubic tenderness Neuro alert and oriented, nonfocal Psych normal mood and affect Data Reviewed:  Creatinine 1.52, hemoglobin 8.6  Family Communication: None  Disposition: Status is: Inpatient Remains inpatient appropriate because: Getting IV antibiotics   Planned  Discharge Destination: Home    DVT prophylaxis-SCDs Time spent: 35 minutes  Author: Max Sane, MD 03/16/2022 12:40 PM  For on call review [email or web address].

## 2022-03-16 NOTE — TOC Initial Note (Signed)
Transition of Care [facility]) - Initial/Assessment Note    Patient Details  Name: Yvette Crane MRN: [id number] Date of Birth: 10-05-1950  Transition of Care [facility]) CM/SW Contact:    Laurena Slimmer, RN Phone Number: 03/16/2022, 2:28 PM  Clinical Narrative:                  Transition of Care [facility]) Screening Note   Patient Details  Name: Yvette Crane Date of Birth: 12-07-1950   Transition of Care [facility]) CM/SW Contact:    Laurena Slimmer, RN Phone Number: 03/16/2022, 2:28 PM    Transition of Care Department [facility]) has reviewed patient and no TOC needs have been identified at this time. We will continue to monitor patient advancement through interdisciplinary progression rounds. If new patient transition needs arise, please place a TOC consult.           Patient Goals and CMS Choice        Expected Discharge Plan and Services                                                Prior Living Arrangements/Services                       Activities of Daily Living Home Assistive Devices/Equipment: None ADL Screening (condition at time of admission) Patient's cognitive ability adequate to safely complete daily activities?: Yes Is the patient deaf or have difficulty hearing?: No Does the patient have difficulty seeing, even when wearing glasses/contacts?: No Does the patient have difficulty concentrating, remembering, or making decisions?: No Patient able to express need for assistance with ADLs?: Yes Does the patient have difficulty dressing or bathing?: No Independently performs ADLs?: Yes (appropriate for developmental age) Does the patient have difficulty walking or climbing stairs?: No Weakness of Legs: None Weakness of Arms/Hands: None  Permission Sought/Granted                  Emotional Assessment              Admission diagnosis:  Hemorrhagic cystitis [N30.91] Sepsis secondary to UTI ([place]) [A41.9, N39.0] Sepsis, due to unspecified  organism, unspecified whether acute organ dysfunction present [facility]) [A41.9] Patient Active Problem List   Diagnosis Date Noted   Sepsis secondary to UTI ([place]) 03/15/2022   CKD stage 3 due to type 2 diabetes mellitus ([place])    Influenza A 10/24/2018   ANEMIA, MILD 10/04/2010   GERD 03/06/2010   GOUT 01/16/2010   OSTEOPENIA 01/04/2010   POSTMENOPAUSAL STATUS 09/17/2009   KNEE PAIN, BILATERAL 08/08/2009   DEPRESSION 06/04/2009   CHEST PAIN UNSPECIFIED 05/17/2009   Disturbance of skin sensation 04/29/2009   DYSLIPIDEMIA 06/09/2007   LOW BACK PAIN SYNDROME 06/09/2007   BURSITIS, HIPS, BILATERAL 06/09/2007   DM 05/06/2007   Hypertension 05/06/2007   VERTIGO 05/06/2007   COLONIC POLYPS, ADENOMATOUS, HX OF 07/25/2004   PCP:  Derinda Late, MD Pharmacy:   [facility] [address] [place] [address] [place] [address] Phone: [phone] Fax: [phone]     Social Determinants of Health (SDOH) Interventions    Readmission Risk Interventions     No data to display

## 2022-03-16 NOTE — Hospital Course (Signed)
71 year old female with a history of diabetes, hypertension, gout is admitted for sepsis due to UTI  8/7: IV antibiotics, ambulation, DC telemetry and resume home medications

## 2022-03-17 ENCOUNTER — Encounter: Payer: Self-pay | Admitting: Internal Medicine

## 2022-03-17 DIAGNOSIS — I159 Secondary hypertension, unspecified: Secondary | ICD-10-CM | POA: Diagnosis not present

## 2022-03-17 DIAGNOSIS — E1122 Type 2 diabetes mellitus with diabetic chronic kidney disease: Secondary | ICD-10-CM | POA: Diagnosis not present

## 2022-03-17 DIAGNOSIS — A419 Sepsis, unspecified organism: Secondary | ICD-10-CM | POA: Diagnosis not present

## 2022-03-17 DIAGNOSIS — N3091 Cystitis, unspecified with hematuria: Secondary | ICD-10-CM

## 2022-03-17 LAB — BASIC METABOLIC PANEL
Anion gap: 7 (ref 5–15)
BUN: 18 mg/dL (ref 8–23)
CO2: 25 mmol/L (ref 22–32)
Calcium: 8.9 mg/dL (ref 8.9–10.3)
Chloride: 106 mmol/L (ref 98–111)
Creatinine, Ser: 1.68 mg/dL — ABNORMAL HIGH (ref 0.44–1.00)
GFR, Estimated: 32 mL/min — ABNORMAL LOW (ref >60)
Glucose, Bld: 133 mg/dL — ABNORMAL HIGH (ref 70–99)
Potassium: 4.4 mmol/L (ref 3.5–5.1)
Sodium: 138 mmol/L (ref 135–145)

## 2022-03-17 LAB — URINE CULTURE: Culture: 50000 — AB

## 2022-03-17 LAB — GLUCOSE, CAPILLARY: Glucose-Capillary: 165 mg/dL — ABNORMAL HIGH (ref 70–99)

## 2022-03-17 LAB — CBC
HCT: 27.5 % — ABNORMAL LOW (ref 36.0–46.0)
Hemoglobin: 8.6 g/dL — ABNORMAL LOW (ref 12.0–15.0)
MCH: 30.4 pg (ref 26.0–34.0)
MCHC: 31.3 g/dL (ref 30.0–36.0)
MCV: 97.2 fL (ref 80.0–100.0)
Platelets: 312 10*3/uL (ref 150–400)
RBC: 2.83 MIL/uL — ABNORMAL LOW (ref 3.87–5.11)
RDW: 12.9 % (ref 11.5–15.5)
WBC: 11.8 10*3/uL — ABNORMAL HIGH (ref 4.0–10.5)
nRBC: 0 % (ref 0.0–0.2)

## 2022-03-17 MED ORDER — OLMESARTAN MEDOXOMIL-HCTZ 40-12.5 MG PO TABS: 1.0000 | ORAL_TABLET | Freq: Every day | ORAL | Status: AC

## 2022-03-17 MED ORDER — ASPIRIN 81 MG PO TABS: 81.0000 mg | ORAL_TABLET | Freq: Every day | ORAL | Status: AC

## 2022-03-17 MED ORDER — CEPHALEXIN 500 MG PO CAPS: 500.0000 mg | ORAL_CAPSULE | Freq: Two times a day (BID) | ORAL | 0 refills | Status: AC

## 2022-03-17 MED ORDER — METFORMIN HCL 1000 MG PO TABS: 1000.0000 mg | ORAL_TABLET | Freq: Two times a day (BID) | ORAL | Status: AC

## 2022-03-17 NOTE — Progress Notes (Signed)
Mobility Specialist - Progress Note    03/17/22 0905  Mobility  Activity Ambulated independently in hallway;Stood at bedside;Dangled on edge of bed  Level of Assistance Independent  Assistive Device None  Distance Ambulated (ft) 100 ft  Activity Response Tolerated well  $Mobility charge 1 Mobility    Pt EOB on RA upon arrival. Pt STS and ambulates indep. Pt returns to room EOB with needs in reach and RN in room.   Gretchen Short  Mobility Specialist  03/17/22 9:06 AM

## 2022-03-17 NOTE — Plan of Care (Signed)
  Problem: Clinical Measurements: Goal: Signs and symptoms of infection will decrease Outcome: Progressing   Problem: Respiratory: Goal: Ability to maintain adequate ventilation will improve Outcome: Progressing   Problem: Education: Goal: Knowledge of General Education information will improve Description: Including pain rating scale, medication(s)/side effects and non-pharmacologic comfort measures Outcome: Progressing   Problem: Health Behavior/Discharge Planning: Goal: Ability to manage health-related needs will improve Outcome: Progressing   Problem: Activity: Goal: Risk for activity intolerance will decrease Outcome: Progressing   Problem: Nutrition: Goal: Adequate nutrition will be maintained Outcome: Progressing

## 2022-03-19 NOTE — Discharge Summary (Signed)
Physician Discharge Summary   Patient: Yvette Crane MRN: [id number] DOB: [birth year]  Admit date:     03/15/2022  Discharge date: 03/17/2022  Discharge Physician: Max Sane   PCP: Derinda Late, MD   Recommendations at discharge:    F/up with outpt providers as requested  Discharge Diagnoses: Principal Problem:   Sepsis secondary to UTI [facility]) Active Problems:   CKD stage 3 due to type 2 diabetes mellitus ([place])   Hypertension   GOUT   Hemorrhagic cystitis  Hospital Course: 71 year old female with a history of diabetes, hypertension, gout is admitted for sepsis due to UTI  8/7: IV antibiotics, ambulation, DC telemetry and resume home medications  Assessment and Plan: * Sepsis secondary to E.coli UTI ([place]) As evidenced by tachycardia, marked leukocytosis as well as lactic acidosis and pyuria  lactic acid levels normalized with treatment. Treated with IV Abx while in the [place] on PO Abx  CKD stage 3 due to type 2 diabetes mellitus ([place]) Hypertension Gout         Disposition: Home Diet recommendation:  Discharge Diet Orders (From admission, onward)     Start     Ordered   03/17/22 0000  Diet - low sodium heart healthy        03/17/22 0955           Carb modified diet DISCHARGE MEDICATION: Allergies as of 03/17/2022   No Known Allergies      Medication List     STOP taking these medications    albuterol 108 (90 Base) MCG/ACT inhaler Commonly known as: VENTOLIN HFA   atorvastatin 10 MG tablet Commonly known as: LIPITOR   colchicine 0.6 MG tablet   etodolac 300 MG capsule Commonly known as: LODINE   losartan 100 MG tablet Commonly known as: COZAAR   omega-3 acid ethyl esters 1 g capsule Commonly known as: LOVAZA   predniSONE 10 MG (21) Tbpk tablet Commonly known as: STERAPRED UNI-PAK 21 TAB       TAKE these medications    acetaminophen 325 MG tablet Commonly known as: TYLENOL Take 2 tablets (650 mg total) by mouth every 6  (six) hours as needed for mild pain (or Fever >/= 101).   allopurinol 100 MG tablet Commonly known as: ZYLOPRIM Take 100 mg by mouth daily.   aspirin 81 MG tablet Take 1 tablet (81 mg total) by mouth daily. Start taking on: March 20, 2022   cephALEXin 500 MG capsule Commonly known as: KEFLEX Take 1 capsule (500 mg total) by mouth 2 (two) times daily for 5 days.   meclizine 32 MG tablet Commonly known as: ANTIVERT Take 32 mg by mouth as needed.   metFORMIN 1000 MG tablet Commonly known as: GLUCOPHAGE Take 1 tablet (1,000 mg total) by mouth 2 (two) times daily. Start taking on: March 20, 2022   nystatin cream Commonly known as: MYCOSTATIN Apply 1 application topically 2 (two) times daily.   olmesartan-hydrochlorothiazide 40-12.5 MG tablet Commonly known as: BENICAR HCT Take 1 tablet by mouth daily. Start taking on: March 20, 2022   OMEGA-3 EPA FISH OIL PO Take 2 capsules by mouth 2 (two) times daily.   pravastatin 20 MG tablet Commonly known as: PRAVACHOL Take 20 mg by mouth daily.        Follow-up Information     Derinda Late, MD. Schedule an appointment as soon as possible for a visit in 1 week(s).   Specialty: Family Medicine Why: [facility] Discharge F/UP Contact information: [phone]  [facility] [place] [address] [phone]         Abbie Sons, MD. Schedule an appointment as soon as possible for a visit in 2 week(s).   Specialty: Urology Why: [facility] Discharge F/UP Contact information: [place] [address] [phone]                Discharge Exam: Danley Danker Weights   03/15/22 0901  Weight: 65 kg   71 year old female lying in the bed comfortably without any acute distress Lungs clear to auscultation bilaterally Cardiovascular regular rate and rhythm Abdomen soft, benign Neuro alert and oriented, nonfocal Psych normal mood and  affect  Condition at discharge: good  The results of significant diagnostics from this hospitalization (including imaging, microbiology, ancillary and laboratory) are listed below for reference.   Imaging Studies: CT ABDOMEN PELVIS WO CONTRAST  Result Date: 03/15/2022 CLINICAL DATA:  Intermittent abdominal pain.  Hematuria. EXAM: CT ABDOMEN AND PELVIS WITHOUT CONTRAST TECHNIQUE: Multidetector CT imaging of the abdomen and pelvis was performed following the standard protocol without IV contrast. RADIATION DOSE REDUCTION: This exam was performed according to the departmental dose-optimization program which includes automated exposure control, adjustment of the mA and/or kV according to patient size and/or use of iterative reconstruction technique. COMPARISON:  None Available. FINDINGS: Lower chest: Unremarkable. Hepatobiliary: No suspicious focal abnormality in the liver on this study without intravenous contrast. There is no evidence for gallstones, gallbladder wall thickening, or pericholecystic fluid. No intrahepatic or extrahepatic biliary dilation. Pancreas: No focal mass lesion. No dilatation of the main duct. No intraparenchymal cyst. No peripancreatic edema. Spleen: No splenomegaly. No focal mass lesion. Adrenals/Urinary Tract: No adrenal nodule or mass. Kidneys unremarkable. No evidence for hydroureter. The urinary bladder appears normal for the degree of distention. Stomach/Bowel: Stomach is unremarkable. No gastric wall thickening. No evidence of outlet obstruction. Duodenum is normally positioned as is the ligament of Treitz. No small bowel wall thickening. No small bowel dilatation. The terminal ileum is normal. The appendix is not well visualized, but there is no edema or inflammation in the region of the cecum. No gross colonic mass. No colonic wall thickening. No substantial diverticular disease. Vascular/Lymphatic: Trace calcification noted in the wall of the abdominal aorta. There is no  gastrohepatic or hepatoduodenal ligament lymphadenopathy. No retroperitoneal or mesenteric lymphadenopathy. No pelvic sidewall lymphadenopathy. Reproductive: Uterus surgically absent.  There is no adnexal mass. Other: No intraperitoneal free fluid. Musculoskeletal: No worrisome lytic or sclerotic osseous abnormality. IMPRESSION: No acute findings in the abdomen or pelvis. Specifically, no findings to explain the patient's history of hematuria. Electronically Signed   By: Misty Stanley M.D.   On: 03/15/2022 10:38    Microbiology: Results for orders placed or performed during the hospital encounter of 03/15/22  Urine Culture     Status: Abnormal   Collection Time: 03/15/22  9:11 AM   Specimen: Urine, Clean Catch  Result Value Ref Range Status   Specimen Description   Final    URINE, CLEAN CATCH Performed at [facility], [address] [place], [place] [address]    Special Requests   Final    NONE Performed at [facility], [address] [place], [place] [address]    Culture 50,000 COLONIES/mL ESCHERICHIA COLI (A)  Final   Report Status 03/17/2022 FINAL  Final   Organism ID, Bacteria ESCHERICHIA COLI (A)  Final      Susceptibility  Escherichia coli - MIC*    AMPICILLIN <=2 SENSITIVE Sensitive     CEFAZOLIN <=4 SENSITIVE Sensitive     CEFEPIME <=0.12 SENSITIVE Sensitive     CEFTRIAXONE <=0.25 SENSITIVE Sensitive     CIPROFLOXACIN <=0.25 SENSITIVE Sensitive     GENTAMICIN <=1 SENSITIVE Sensitive     IMIPENEM <=0.25 SENSITIVE Sensitive     NITROFURANTOIN <=16 SENSITIVE Sensitive     TRIMETH/SULFA >=320 RESISTANT Resistant     AMPICILLIN/SULBACTAM <=2 SENSITIVE Sensitive     PIP/TAZO <=4 SENSITIVE Sensitive     * 50,000 COLONIES/mL ESCHERICHIA COLI  Blood culture (routine x 2)     Status: None (Preliminary result)   Collection Time: 03/15/22 10:58 AM   Specimen: BLOOD LEFT HAND  Result Value Ref Range Status   Specimen Description BLOOD LEFT HAND  Final    Special Requests   Final    BOTTLES DRAWN AEROBIC AND ANAEROBIC Blood Culture adequate volume   Culture   Final    NO GROWTH 4 DAYS Performed at [facility], [address] [place], [place] [address]    Report Status PENDING  Incomplete  Blood culture (routine x 2)     Status: None (Preliminary result)   Collection Time: 03/15/22 10:58 AM   Specimen: BLOOD  Result Value Ref Range Status   Specimen Description BLOOD BLOOD LEFT FOREARM  Final   Special Requests   Final    BOTTLES DRAWN AEROBIC AND ANAEROBIC Blood Culture results may not be optimal due to an inadequate volume of blood received in culture bottles   Culture   Final    NO GROWTH 4 DAYS Performed at [facility], [address] [place], [place] [address]    Report Status PENDING  Incomplete    Labs: CBC: Recent Labs  Lab 03/15/22 0911 03/16/22 0431 03/17/22 0525  WBC 21.8* 13.5* 11.8*  HGB 10.6* 8.6* 8.6*  HCT 34.1* 26.8* 27.5*  MCV 97.2 96.4 97.2  PLT 387 305 474   Basic Metabolic Panel: Recent Labs  Lab 03/15/22 0911 03/16/22 0431 03/17/22 0525  NA 135 139 138  K 4.5 4.1 4.4  CL 100 106 106  CO2 '24 24 25  '$ GLUCOSE 167* 126* 133*  BUN 25* 19 18  CREATININE 1.82* 1.52* 1.68*  CALCIUM 9.7 8.9 8.9   Liver Function Tests: Recent Labs  Lab 03/15/22 0911  AST 28  ALT 8  ALKPHOS 87  BILITOT 0.8  PROT 7.9  ALBUMIN 3.9   CBG: Recent Labs  Lab 03/16/22 0723 03/16/22 1109 03/16/22 1642 03/16/22 2054 03/17/22 0750  GLUCAP 89 120* 117* 125* 165*    Discharge time spent: greater than 30 minutes.  Signed: Max Sane, MD [facility] 03/19/2022

## 2022-03-20 ENCOUNTER — Telehealth: Payer: Self-pay

## 2022-03-20 LAB — CULTURE, BLOOD (ROUTINE X 2)
Culture: NO GROWTH
Culture: NO GROWTH
Special Requests: ADEQUATE

## 2022-03-20 NOTE — Telephone Encounter (Signed)
   Telephone encounter was:  Successful.  03/20/2022 Name: Yvette Crane MRN: [id number] DOB: 1950-10-19  [name] is a 71 y.o. year old female who is a primary care patient of Derinda Late, MD . The community resource team was consulted for assistance with [place] guide performed the following interventions: Patient provided with information about care guide support team and interviewed to confirm resource needs.Patient requested food resources to be mailed to her  Follow Up Plan:  No further follow up planned at this time. The patient has been provided with needed resources.    [place], Care Management  [phone] [address], [place], [place] [address] Phone: [phone] Email: [email or web address]

## 2022-03-24 DIAGNOSIS — Z8619 Personal history of other infectious and parasitic diseases: Secondary | ICD-10-CM | POA: Diagnosis not present

## 2022-03-24 DIAGNOSIS — D62 Acute posthemorrhagic anemia: Secondary | ICD-10-CM | POA: Diagnosis not present

## 2022-06-01 DIAGNOSIS — N1831 Chronic kidney disease, stage 3a: Secondary | ICD-10-CM | POA: Diagnosis not present

## 2022-06-01 DIAGNOSIS — Z79899 Other long term (current) drug therapy: Secondary | ICD-10-CM | POA: Diagnosis not present

## 2022-06-01 DIAGNOSIS — E1122 Type 2 diabetes mellitus with diabetic chronic kidney disease: Secondary | ICD-10-CM | POA: Diagnosis not present

## 2022-06-01 DIAGNOSIS — E7800 Pure hypercholesterolemia, unspecified: Secondary | ICD-10-CM | POA: Diagnosis not present

## 2022-06-08 DIAGNOSIS — Z79899 Other long term (current) drug therapy: Secondary | ICD-10-CM | POA: Diagnosis not present

## 2022-06-08 DIAGNOSIS — E1122 Type 2 diabetes mellitus with diabetic chronic kidney disease: Secondary | ICD-10-CM | POA: Diagnosis not present

## 2022-06-08 DIAGNOSIS — Z23 Encounter for immunization: Secondary | ICD-10-CM | POA: Diagnosis not present

## 2022-06-08 DIAGNOSIS — I129 Hypertensive chronic kidney disease with stage 1 through stage 4 chronic kidney disease, or unspecified chronic kidney disease: Secondary | ICD-10-CM | POA: Diagnosis not present

## 2022-06-08 DIAGNOSIS — E785 Hyperlipidemia, unspecified: Secondary | ICD-10-CM | POA: Diagnosis not present

## 2022-06-08 DIAGNOSIS — N189 Chronic kidney disease, unspecified: Secondary | ICD-10-CM | POA: Diagnosis not present

## 2022-08-27 DIAGNOSIS — H43813 Vitreous degeneration, bilateral: Secondary | ICD-10-CM | POA: Diagnosis not present

## 2022-08-27 DIAGNOSIS — E119 Type 2 diabetes mellitus without complications: Secondary | ICD-10-CM | POA: Diagnosis not present

## 2022-08-27 DIAGNOSIS — H2513 Age-related nuclear cataract, bilateral: Secondary | ICD-10-CM | POA: Diagnosis not present

## 2022-08-27 DIAGNOSIS — H35373 Puckering of macula, bilateral: Secondary | ICD-10-CM | POA: Diagnosis not present

## 2022-09-15 DIAGNOSIS — E1122 Type 2 diabetes mellitus with diabetic chronic kidney disease: Secondary | ICD-10-CM | POA: Diagnosis not present

## 2022-09-15 DIAGNOSIS — N184 Chronic kidney disease, stage 4 (severe): Secondary | ICD-10-CM | POA: Diagnosis not present

## 2022-09-15 DIAGNOSIS — Z79899 Other long term (current) drug therapy: Secondary | ICD-10-CM | POA: Diagnosis not present

## 2022-09-22 DIAGNOSIS — E1122 Type 2 diabetes mellitus with diabetic chronic kidney disease: Secondary | ICD-10-CM | POA: Diagnosis not present

## 2022-09-22 DIAGNOSIS — I129 Hypertensive chronic kidney disease with stage 1 through stage 4 chronic kidney disease, or unspecified chronic kidney disease: Secondary | ICD-10-CM | POA: Diagnosis not present

## 2022-09-22 DIAGNOSIS — N189 Chronic kidney disease, unspecified: Secondary | ICD-10-CM | POA: Diagnosis not present

## 2022-09-22 DIAGNOSIS — R059 Cough, unspecified: Secondary | ICD-10-CM | POA: Diagnosis not present

## 2022-09-22 DIAGNOSIS — D631 Anemia in chronic kidney disease: Secondary | ICD-10-CM | POA: Diagnosis not present

## 2022-12-10 ENCOUNTER — Other Ambulatory Visit: Payer: Self-pay | Admitting: Family Medicine

## 2022-12-10 DIAGNOSIS — Z1231 Encounter for screening mammogram for malignant neoplasm of breast: Secondary | ICD-10-CM

## 2023-01-06 DIAGNOSIS — E1122 Type 2 diabetes mellitus with diabetic chronic kidney disease: Secondary | ICD-10-CM | POA: Diagnosis not present

## 2023-01-06 DIAGNOSIS — N184 Chronic kidney disease, stage 4 (severe): Secondary | ICD-10-CM | POA: Diagnosis not present

## 2023-01-06 DIAGNOSIS — E7800 Pure hypercholesterolemia, unspecified: Secondary | ICD-10-CM | POA: Diagnosis not present

## 2023-01-14 DIAGNOSIS — I129 Hypertensive chronic kidney disease with stage 1 through stage 4 chronic kidney disease, or unspecified chronic kidney disease: Secondary | ICD-10-CM | POA: Diagnosis not present

## 2023-01-14 DIAGNOSIS — E1122 Type 2 diabetes mellitus with diabetic chronic kidney disease: Secondary | ICD-10-CM | POA: Diagnosis not present

## 2023-01-14 DIAGNOSIS — Z79899 Other long term (current) drug therapy: Secondary | ICD-10-CM | POA: Diagnosis not present

## 2023-01-14 DIAGNOSIS — Z1331 Encounter for screening for depression: Secondary | ICD-10-CM | POA: Diagnosis not present

## 2023-01-14 DIAGNOSIS — Z0000 Encounter for general adult medical examination without abnormal findings: Secondary | ICD-10-CM | POA: Diagnosis not present

## 2023-01-14 DIAGNOSIS — Z87891 Personal history of nicotine dependence: Secondary | ICD-10-CM | POA: Diagnosis not present

## 2023-01-14 DIAGNOSIS — N189 Chronic kidney disease, unspecified: Secondary | ICD-10-CM | POA: Diagnosis not present

## 2023-01-19 ENCOUNTER — Ambulatory Visit
Admission: RE | Admit: 2023-01-19 | Discharge: 2023-01-19 | Disposition: A | Discharge status: Home / Self Care | Payer: Medicare HMO | Source: Ambulatory Visit | Attending: Family Medicine | Admitting: Family Medicine

## 2023-01-19 DIAGNOSIS — Z1231 Encounter for screening mammogram for malignant neoplasm of breast: Secondary | ICD-10-CM | POA: Diagnosis not present

## 2023-04-04 ENCOUNTER — Ambulatory Visit
Admission: EM | Admit: 2023-04-04 | Discharge: 2023-04-04 | Disposition: A | Discharge status: Home / Self Care | Payer: Medicare HMO | Attending: Emergency Medicine | Admitting: Emergency Medicine

## 2023-04-04 DIAGNOSIS — Z20822 Contact with and (suspected) exposure to covid-19: Secondary | ICD-10-CM

## 2023-04-04 DIAGNOSIS — B349 Viral infection, unspecified: Secondary | ICD-10-CM

## 2023-04-04 DIAGNOSIS — R42 Dizziness and giddiness: Secondary | ICD-10-CM

## 2023-04-04 DIAGNOSIS — R197 Diarrhea, unspecified: Secondary | ICD-10-CM

## 2023-04-04 DIAGNOSIS — R112 Nausea with vomiting, unspecified: Secondary | ICD-10-CM | POA: Diagnosis not present

## 2023-04-04 MED ORDER — ONDANSETRON 4 MG PO TBDP
4.0000 mg | ORAL_TABLET | Freq: Three times a day (TID) | ORAL | 0 refills | Status: AC | PRN
Start: 2023-04-04 — End: ?

## 2023-04-04 NOTE — ED Notes (Addendum)
Patient would like her Granddaughter, Tim Lair contacted if she has a positive Covid test result.  [phone]

## 2023-04-04 NOTE — ED Provider Notes (Signed)
Renaldo Fiddler    CSN: [id number] Arrival date & time: 04/04/23  1124      History   Chief Complaint Chief Complaint  Patient presents with   Headache   Diarrhea   Dizziness    HPI Yvette Crane is a 72 y.o. female.  Accompanied by her granddaughter, patient presents with 1 day history of headache, nausea, vomiting, diarrhea, dizziness with turning her head.  2 episodes of emesis and 3 episodes of diarrhea since onset of illness.  No fever, cough, shortness of breath, chest pain, abdominal pain, or other symptoms.  She reports exposure to COVID prior to the onset of her symptoms.  Her medical history includes hypertension, diabetes, CKD.  The history is provided by the patient, a relative and medical records.    Past Medical History:  Diagnosis Date   Diabetes mellitus    Gout    Hyperlipidemia    Hypertension     Patient Active Problem List   Diagnosis Date Noted   Hemorrhagic cystitis    Sepsis secondary to UTI (HCC) 03/15/2022   CKD stage 3 due to type 2 diabetes mellitus ([facility])    Influenza A 10/24/2018   ANEMIA, MILD 10/04/2010   GERD 03/06/2010   GOUT 01/16/2010   OSTEOPENIA 01/04/2010   POSTMENOPAUSAL STATUS 09/17/2009   KNEE PAIN, BILATERAL 08/08/2009   DEPRESSION 06/04/2009   CHEST PAIN UNSPECIFIED 05/17/2009   Disturbance of skin sensation 04/29/2009   DYSLIPIDEMIA 06/09/2007   LOW BACK PAIN SYNDROME 06/09/2007   BURSITIS, HIPS, BILATERAL 06/09/2007   DM 05/06/2007   Hypertension 05/06/2007   VERTIGO 05/06/2007   COLONIC POLYPS, ADENOMATOUS, HX OF 07/25/2004    Past Surgical History:  Procedure Laterality Date   ABDOMINAL HYSTERECTOMY     TUBAL LIGATION      OB History   No obstetric history on file.      Home Medications    Prior to Admission medications   Medication Sig Start Date End Date Taking? Authorizing Provider  ondansetron (ZOFRAN-ODT) 4 MG disintegrating tablet Take 1 tablet (4 mg total) by mouth every 8 (eight) hours  as needed for nausea or vomiting. 04/04/23  Yes Mickie Bail, NP  acetaminophen (TYLENOL) 325 MG tablet Take 2 tablets (650 mg total) by mouth every 6 (six) hours as needed for mild pain (or Fever >/= 101). 10/26/18   Altamese Dilling, MD  allopurinol (ZYLOPRIM) 100 MG tablet Take 100 mg by mouth daily. 07/04/18   [provider]  aspirin 81 MG tablet Take 1 tablet (81 mg total) by mouth daily. 03/20/22   Delfino Lovett, MD  meclizine (ANTIVERT) 32 MG tablet Take 32 mg by mouth as needed.      [provider]  metFORMIN (GLUCOPHAGE) 1000 MG tablet Take 1 tablet (1,000 mg total) by mouth 2 (two) times daily. 03/20/22   Delfino Lovett, MD  nystatin cream (MYCOSTATIN) Apply 1 application topically 2 (two) times daily. 12/16/15   [provider]  olmesartan-hydrochlorothiazide (BENICAR HCT) 40-12.5 MG tablet Take 1 tablet by mouth daily. 03/20/22   Delfino Lovett, MD  Omega-3 Fatty Acids (OMEGA-3 EPA FISH OIL PO) Take 2 capsules by mouth 2 (two) times daily.    [provider]  pravastatin (PRAVACHOL) 20 MG tablet Take 20 mg by mouth daily. 12/17/21   [provider]    Family History Family History  Problem Relation Age of Onset   Breast cancer Neg Hx     Social History Social History  Tobacco Use   Smoking status: Former    Current packs/day: 0.00    Types: Cigarettes    Quit date: 12/18/1980    Years since quitting: 42.3   Smokeless tobacco: Never  Substance Use Topics   Alcohol use: No   Drug use: No     Allergies   Patient has no known allergies.   Review of Systems Review of Systems  Constitutional:  Negative for chills and fever.  HENT:  Negative for ear pain and sore throat.   Respiratory:  Negative for cough and shortness of breath.   Cardiovascular:  Negative for chest pain and palpitations.  Gastrointestinal:  Negative for abdominal pain, diarrhea, nausea and vomiting.  Skin:  Negative for color change and rash.  Neurological:   Positive for dizziness and headaches. Negative for syncope, weakness and numbness.     Physical Exam Triage Vital Signs ED Triage Vitals  Encounter Vitals Group     BP      Systolic BP Percentile      Diastolic BP Percentile      Pulse      Resp      Temp      Temp src      SpO2      Weight      Height      Head Circumference      Peak Flow      Pain Score      Pain Loc      Pain Education      Exclude from Growth Chart    No data found.  Updated Vital Signs BP 130/86   Pulse 89   Temp 98.5 F (36.9 C)   Resp 18   SpO2 98%   Visual Acuity Right Eye Distance:   Left Eye Distance:   Bilateral Distance:    Right Eye Near:   Left Eye Near:    Bilateral Near:     Physical Exam Vitals and nursing note reviewed.  Constitutional:      General: She is not in acute distress.    Appearance: She is well-developed.  HENT:     Right Ear: Tympanic membrane normal.     Left Ear: Tympanic membrane normal.     Nose: Nose normal.     Mouth/Throat:     Mouth: Mucous membranes are moist.     Pharynx: Oropharynx is clear.  Cardiovascular:     Rate and Rhythm: Normal rate and regular rhythm.     Heart sounds: Normal heart sounds.  Pulmonary:     Effort: Pulmonary effort is normal. No respiratory distress.     Breath sounds: Normal breath sounds.  Abdominal:     General: Bowel sounds are normal.     Palpations: Abdomen is soft.     Tenderness: There is no abdominal tenderness. There is no right CVA tenderness, left CVA tenderness, guarding or rebound.  Musculoskeletal:     Cervical back: Neck supple.  Skin:    General: Skin is warm and dry.  Neurological:     General: No focal deficit present.     Mental Status: She is alert.     Sensory: No sensory deficit.     Motor: No weakness.     Gait: Gait normal.  Psychiatric:        Mood and Affect: Mood normal.        Behavior: Behavior normal.      UC Treatments / Results  Labs (all labs ordered  are listed, but  only abnormal results are displayed) Labs Reviewed  SARS CORONAVIRUS 2 (TAT 6-24 HRS)    EKG   Radiology No results found.  Procedures Procedures (including critical care time)  Medications Ordered in UC Medications - No data to display  Initial Impression / Assessment and Plan / UC Course  I have reviewed the triage vital signs and the nursing notes.  Pertinent labs & imaging results that were available during my care of the patient were reviewed by me and considered in my medical decision making (see chart for details).   Viral illness, nausea, vomiting, diarrhea, dizziness, exposure to COVID.  COVID pending.  If positive for COVID, recommend treatment with molnupiravir.  Treating nausea and vomiting with Zofran.  Discussed clear liquid diet.  Instructed patient to advance to diarrhea diet as tolerated.  ED precautions discussed.  Education provided on gastroenteritis and dizziness.  Instructed patient to follow-up with her PCP.  She agrees to plan of care.  Discharge instructions reviewed with granddaughter as well with patient approval.  Final Clinical Impressions(s) / UC Diagnoses   Final diagnoses:  Viral illness  Nausea vomiting and diarrhea  Dizziness  Exposure to COVID-19 virus     Discharge Instructions      Your COVID test is pending.    Take the antinausea medication as directed.    Keep yourself hydrated with clear liquids, such as water and Gatorade.  Follow the diarrhea diet as tolerated.   Go to the emergency department if you have worsening symptoms.    Follow up with your primary care provider.           ED Prescriptions     Medication Sig Dispense Auth. Provider   ondansetron (ZOFRAN-ODT) 4 MG disintegrating tablet Take 1 tablet (4 mg total) by mouth every 8 (eight) hours as needed for nausea or vomiting. 20 tablet Mickie Bail, NP      PDMP not reviewed this encounter.   Mickie Bail, NP 04/04/23 1328

## 2023-04-04 NOTE — Discharge Instructions (Addendum)
Your COVID test is pending.    Take the antinausea medication as directed.    Keep yourself hydrated with clear liquids, such as water and Gatorade.  Follow the diarrhea diet as tolerated.   Go to the emergency department if you have worsening symptoms.    Follow up with your primary care provider.

## 2023-04-04 NOTE — ED Triage Notes (Addendum)
Patient to Urgent Care with complaints of headache, emesis, diarrhea. Some dizziness when moving head.   Symptoms started last night. Unknown fevers. Did wake up soaked with sweat.   Multiple close contacts on Thursday with Covid.

## 2023-04-05 LAB — SARS CORONAVIRUS 2 (TAT 6-24 HRS): SARS Coronavirus 2: NEGATIVE

## 2023-07-06 DIAGNOSIS — N184 Chronic kidney disease, stage 4 (severe): Secondary | ICD-10-CM | POA: Diagnosis not present

## 2023-07-06 DIAGNOSIS — E1122 Type 2 diabetes mellitus with diabetic chronic kidney disease: Secondary | ICD-10-CM | POA: Diagnosis not present

## 2023-07-13 DIAGNOSIS — E1122 Type 2 diabetes mellitus with diabetic chronic kidney disease: Secondary | ICD-10-CM | POA: Diagnosis not present

## 2023-07-13 DIAGNOSIS — N189 Chronic kidney disease, unspecified: Secondary | ICD-10-CM | POA: Diagnosis not present

## 2023-07-13 DIAGNOSIS — E785 Hyperlipidemia, unspecified: Secondary | ICD-10-CM | POA: Diagnosis not present

## 2023-07-13 DIAGNOSIS — I129 Hypertensive chronic kidney disease with stage 1 through stage 4 chronic kidney disease, or unspecified chronic kidney disease: Secondary | ICD-10-CM | POA: Diagnosis not present

## 2023-07-13 DIAGNOSIS — Z79899 Other long term (current) drug therapy: Secondary | ICD-10-CM | POA: Diagnosis not present

## 2023-07-13 DIAGNOSIS — D631 Anemia in chronic kidney disease: Secondary | ICD-10-CM | POA: Diagnosis not present

## 2023-08-30 DIAGNOSIS — H43813 Vitreous degeneration, bilateral: Secondary | ICD-10-CM | POA: Diagnosis not present

## 2023-08-30 DIAGNOSIS — H35373 Puckering of macula, bilateral: Secondary | ICD-10-CM | POA: Diagnosis not present

## 2023-08-30 DIAGNOSIS — E119 Type 2 diabetes mellitus without complications: Secondary | ICD-10-CM | POA: Diagnosis not present

## 2023-08-30 DIAGNOSIS — Z0100 Encounter for examination of eyes and vision without abnormal findings: Secondary | ICD-10-CM | POA: Diagnosis not present

## 2023-08-30 DIAGNOSIS — H2513 Age-related nuclear cataract, bilateral: Secondary | ICD-10-CM | POA: Diagnosis not present

## 2024-01-10 DIAGNOSIS — N184 Chronic kidney disease, stage 4 (severe): Secondary | ICD-10-CM | POA: Diagnosis not present

## 2024-01-10 DIAGNOSIS — E1122 Type 2 diabetes mellitus with diabetic chronic kidney disease: Secondary | ICD-10-CM | POA: Diagnosis not present

## 2024-01-10 DIAGNOSIS — E7800 Pure hypercholesterolemia, unspecified: Secondary | ICD-10-CM | POA: Diagnosis not present

## 2024-01-10 DIAGNOSIS — Z79899 Other long term (current) drug therapy: Secondary | ICD-10-CM | POA: Diagnosis not present

## 2024-01-11 ENCOUNTER — Other Ambulatory Visit: Payer: Self-pay | Admitting: Family Medicine

## 2024-01-11 DIAGNOSIS — Z1231 Encounter for screening mammogram for malignant neoplasm of breast: Secondary | ICD-10-CM

## 2024-01-17 DIAGNOSIS — Z1331 Encounter for screening for depression: Secondary | ICD-10-CM | POA: Diagnosis not present

## 2024-01-17 DIAGNOSIS — Z0000 Encounter for general adult medical examination without abnormal findings: Secondary | ICD-10-CM | POA: Diagnosis not present

## 2024-01-21 ENCOUNTER — Ambulatory Visit
Admission: RE | Admit: 2024-01-21 | Discharge: 2024-01-21 | Disposition: A | Discharge status: Home / Self Care | Source: Ambulatory Visit | Attending: Family Medicine | Admitting: Family Medicine

## 2024-01-21 DIAGNOSIS — Z1231 Encounter for screening mammogram for malignant neoplasm of breast: Secondary | ICD-10-CM | POA: Insufficient documentation

## 2024-01-25 NOTE — Congregational Nurse Program (Signed)
  Dept: [phone]   Congregational Nurse Program Note  Date of Encounter: 01/19/2024  Clinic visit to check blood pressure and weight. BP 130/72, pulse 76 and regular, O2 Sat 100%.  Discussed managing weight to maintain blood glucose at normal levels and prevent complications of diabetes.  Had just eaten breakfast at time of visit, explained when to check blood glucose.   Past Medical History: Past Medical History:  Diagnosis Date   Diabetes mellitus    Gout    Hyperlipidemia    Hypertension     Encounter Details:  Community Questionnaire - 01/19/24 1040       Questionnaire   Ask client: Do you give verbal consent for me to treat you today? Yes    Student Assistance N/A    Location Patient Served  [facility]    Encounter Setting CN site    Population Status Unknown   Has apartment at [employer]

## 2024-01-25 NOTE — Congregational Nurse Program (Signed)
  Dept: [phone]   Congregational Nurse Program Note  Date of Encounter: 01/12/2024  Clinic visit to check weight and blood pressure, BP 118/72, pulse 76 and regular, weight 192 Lbs.  Discussed weight management to prevent complications of diabetes and heart disease. Past Medical History: Past Medical History:  Diagnosis Date   Diabetes mellitus    Gout    Hyperlipidemia    Hypertension     Encounter Details:  Community Questionnaire - 01/12/24 1055       Questionnaire   Ask client: Do you give verbal consent for me to treat you today? Yes    Student Assistance N/A    Location Patient Served  [facility]    Encounter Setting CN site    Population Status Unknown   Has apartment at [facility]    Insurance/Financial Assistance Referral N/A    Medication Patient Medications Reviewed    Medical Provider Yes    Screening Referrals Made N/A    Medical Referrals Made N/A    Medical Appointment Completed N/A    CNP Interventions Advocate/Support;Counsel;Educate    Screenings CN Performed Blood Pressure;Weight    ED Visit Averted N/A    Life-Saving Intervention Made N/A

## 2024-02-01 NOTE — Congregational Nurse Program (Signed)
  Dept: [phone]   Congregational Nurse Program Note  Date of Encounter: 01/26/2024  Clinic visit to check blood pressure, blood glucose and weight.  BP 136/70, pulse 88 and regular, O2 Sat 99%, weight 195 Lbs.  Unable to check blood glucose as resident had eaten just prior to clinic visit.  Educated regarding blood glucose level pre and post meals.  Discussed weight management to help prevent complications of diabetes and hypertension. Past Medical History: Past Medical History:  Diagnosis Date   Diabetes mellitus    Gout    Hyperlipidemia    Hypertension     Encounter Details:  Community Questionnaire - 01/26/24 1030       Questionnaire   Ask client: Do you give verbal consent for me to treat you today? Yes    Student Assistance N/A    Location Patient Served  [name]    Encounter Setting CN site    Population Status Unknown   Has apartment at [facility]    Insurance/Financial Assistance Referral N/A    Medication Patient Medications Reviewed    Medical Provider Yes    Screening Referrals Made N/A    Medical Referrals Made N/A    Medical Appointment Completed Non-[facility] PCP/Clinic    CNP Interventions Advocate/Support;Counsel;Educate    Screenings CN Performed Blood Pressure;Weight    ED Visit Averted N/A    Life-Saving Intervention Made N/A

## 2024-02-09 NOTE — Congregational Nurse Program (Signed)
  Dept: [phone]   Congregational Nurse Program Note  Date of Encounter: 02/02/2024  Clinic visit to check blood pressure and weight, BP 126/78, pulse 83 and regular, O2 Sat 99%.  Discussed foods to prevent weight gain. Past Medical History: Past Medical History:  Diagnosis Date   Diabetes mellitus    Gout    Hyperlipidemia    Hypertension     Encounter Details:

## 2024-02-16 LAB — GLUCOSE, POCT (MANUAL RESULT ENTRY): POC Glucose: 149 mg/dL — AB (ref 70–99)

## 2024-02-16 NOTE — Congregational Nurse Program (Signed)
  Dept: [phone]   Congregational Nurse Program Note  Date of Encounter: 02/16/2024  Clinic visit to check blood pressure, weight and blood glucose.  BP 132/80, weight  195 Lbs, blood glucose 149 ac lunch.  Educated regarding normal blood glucose levels and strategies to lose weight to improve blood glucose. Past Medical History: Past Medical History:  Diagnosis Date   Diabetes mellitus    Gout    Hyperlipidemia    Hypertension     Encounter Details:  Community Questionnaire - 02/16/24 1145       Questionnaire   Ask client: Do you give verbal consent for me to treat you today? Yes    Student Assistance N/A    Location Patient Served  [name]    Encounter Setting CN site    Population Status Unknown   Has apartment at [facility]    Insurance/Financial Assistance Referral N/A    Medication Patient Medications Reviewed    Medical Provider Yes    Screening Referrals Made N/A    Medical Referrals Made N/A    Medical Appointment Completed N/A    CNP Interventions Advocate/Support;Counsel;Educate    Screenings CN Performed Blood Pressure;Weight;Blood Glucose    ED Visit Averted N/A    Life-Saving Intervention Made N/A            Dept: [phone]   Congregational Nurse Program Note  Date of Encounter: 02/16/2024  Clinic visit to check blood pressure, blood glucose and weight.  132/80, pulse 72 and regular, weight 195 Lbs,  Blood glucose 149 PC breakfast, reviewed meals for this week, educated regarding need to increase intake of vegetables and water to help manage blood glucose level. Past Medical History: Past Medical History:  Diagnosis Date   Diabetes mellitus    Gout    Hyperlipidemia    Hypertension     Encounter Details:  Community Questionnaire - 02/16/24 1145       Questionnaire   Ask client: Do you give verbal consent for me to treat you today? Yes    Student Assistance N/A    Location Patient Served  [name]    Encounter Setting CN site    Population Status Unknown   Has apartment at [facility]    Insurance/Financial Assistance Referral N/A    Medication Patient Medications Reviewed    Medical Provider Yes    Screening Referrals Made N/A    Medical Referrals Made N/A    Medical Appointment Completed N/A    CNP Interventions Advocate/Support;Counsel;Educate    Screenings CN Performed Blood Pressure;Weight    ED Visit Averted N/A    Life-Saving Intervention Made N/A

## 2024-02-23 LAB — GLUCOSE, POCT (MANUAL RESULT ENTRY): POC Glucose: 148 mg/dL — AB (ref 70–99)

## 2024-03-01 LAB — GLUCOSE, POCT (MANUAL RESULT ENTRY): POC Glucose: 135 mg/dL — AB (ref 70–99)

## 2024-03-01 NOTE — Congregational Nurse Program (Signed)
  Dept: [phone]   Congregational Nurse Program Note  Date of Encounter: 03/01/2024  Clinic visit to check blood pressure, blood glucose and weight.  Discussed types of carbohydrates and how they effect blood glucose levels. Past Medical History: Past Medical History:  Diagnosis Date   Diabetes mellitus    Gout    Hyperlipidemia    Hypertension     Encounter Details:  Community Questionnaire - 02/23/24 1020       Questionnaire   Ask client: Do you give verbal consent for me to treat you today? Yes    Student Assistance N/A    Location Patient Served  [name]    Encounter Setting CN site    Population Status Unknown    Insurance Medicare    Insurance/Financial Assistance Referral N/A    Medication Patient Medications Reviewed    Medical Provider Yes    Screening Referrals Made N/A    Medical Referrals Made N/A    Medical Appointment Completed N/A    CNP Interventions Advocate/Support;Counsel;Educate    Screenings CN Performed Blood Pressure;Blood Glucose;Weight    ED Visit Averted N/A    Life-Saving Intervention Made N/A

## 2024-03-01 NOTE — Congregational Nurse Program (Signed)
  Dept: [phone]   Congregational Nurse Program Note  Date of Encounter: 03/01/2024  Clinic visit to check blood pressure, blood glucose and weight.  BP 130/80, pulse 78 and regular, O2 Sat 99%.  Blood glucose 135 pc breakfast, weight 189 Lbs, has lost 3 Lbs.   Discussed eating more vegetables and fruit to continue weight loss.  Past Medical History: Past Medical History:  Diagnosis Date   Diabetes mellitus    Gout    Hyperlipidemia    Hypertension     Encounter Details:  Community Questionnaire - 03/01/24 1230       Questionnaire   Ask client: Do you give verbal consent for me to treat you today? Yes    Student Assistance N/A    Location Patient Served  [facility]    Encounter Setting CN site    Population Status Unknown   Has apartment at [facility]    Insurance/Financial Assistance Referral N/A    Medication Patient Medications Reviewed    Medical Provider Yes    Screening Referrals Made N/A    Medical Referrals Made N/A    Medical Appointment Completed N/A    CNP Interventions Advocate/Support;Counsel;Educate    Screenings CN Performed Blood Pressure;Weight;Blood Glucose    ED Visit Averted N/A    Life-Saving Intervention Made N/A

## 2024-03-08 LAB — GLUCOSE, POCT (MANUAL RESULT ENTRY): POC Glucose: 156 mg/dL — AB (ref 70–99)

## 2024-03-08 NOTE — Congregational Nurse Program (Signed)
  Dept: [phone]   Congregational Nurse Program Note  Date of Encounter: 03/08/2024  Clinic visit to check blood pressure, blood glucose and weight.  BP 128/76, pulse 80 and regular.  Weight 190 Lbs, blood glucose 156 approximately one hour after breakfast, discussed eating more vegetables and proper time to take metformin  to achieve best results in lowering blood glucose. Past Medical History: Past Medical History:  Diagnosis Date   Diabetes mellitus    Gout    Hyperlipidemia    Hypertension     Encounter Details:  Community Questionnaire - 03/08/24 1035       Questionnaire   Ask client: Do you give verbal consent for me to treat you today? Yes    Student Assistance N/A    Location Patient Served  [name]    Encounter Setting CN site    Population Status Unknown   Has apartment at [facility] [address]    Insurance/Financial Assistance Referral N/A    Medication Patient Medications Reviewed    Medical Provider Yes    Screening Referrals Made N/A    Medical Referrals Made N/A    Medical Appointment Completed N/A    CNP Interventions Advocate/Support;Counsel;Educate    Screenings CN Performed Blood Pressure;Weight;Blood Glucose    ED Visit Averted N/A    Life-Saving Intervention Made N/A

## 2024-03-15 LAB — GLUCOSE, POCT (MANUAL RESULT ENTRY): POC Glucose: 144 mg/dL — AB (ref 70–99)

## 2024-03-15 NOTE — Congregational Nurse Program (Signed)
  Dept: [phone]   Congregational Nurse Program Note  Date of Encounter: 03/15/2024  Clinic visit to check weight, blood pressure and blood glucose.  BP 120/76, weight 190 Lbs, blood glucose 144 pc breakfast. Has lost 2 Lbs in past week, reviewed meal choices and discussed how to limit carbohydrate foods. Past Medical History: Past Medical History:  Diagnosis Date   Diabetes mellitus    Gout    Hyperlipidemia    Hypertension     Encounter Details:  Community Questionnaire - 03/15/24 1022       Questionnaire   Ask client: Do you give verbal consent for me to treat you today? Yes    Student Assistance N/A    [facility]    Encounter Setting CN site    Population Status Unknown   Has own apartment at [employer] [facility]    Insurance/Financial Assistance Referral N/A    Medication Patient Medications Reviewed    Medical Provider Yes    Screening Referrals Made N/A    Medical Referrals Made N/A    Medical Appointment Completed N/A    CNP Interventions Advocate/Support;Counsel;Educate;Spiritual Care    Screenings CN Performed Blood Pressure;Blood Glucose;Weight    ED Visit Averted N/A    Life-Saving Intervention Made N/A

## 2024-04-04 IMAGING — MG MM DIGITAL SCREENING BILAT W/ TOMO AND CAD
6 of 10 series · 6 of 30 positions shown · non-contrast
Comparison: Previous exam(s).

CLINICAL DATA: Screening.

EXAM:
DIGITAL SCREENING BILATERAL MAMMOGRAM WITH TOMOSYNTHESIS AND CAD
TECHNIQUE: Bilateral screening digital craniocaudal and mediolateral oblique
mammograms were obtained. Bilateral screening digital breast
tomosynthesis was performed. The images were evaluated with
computer-aided detection.

[R CC synth-2D]
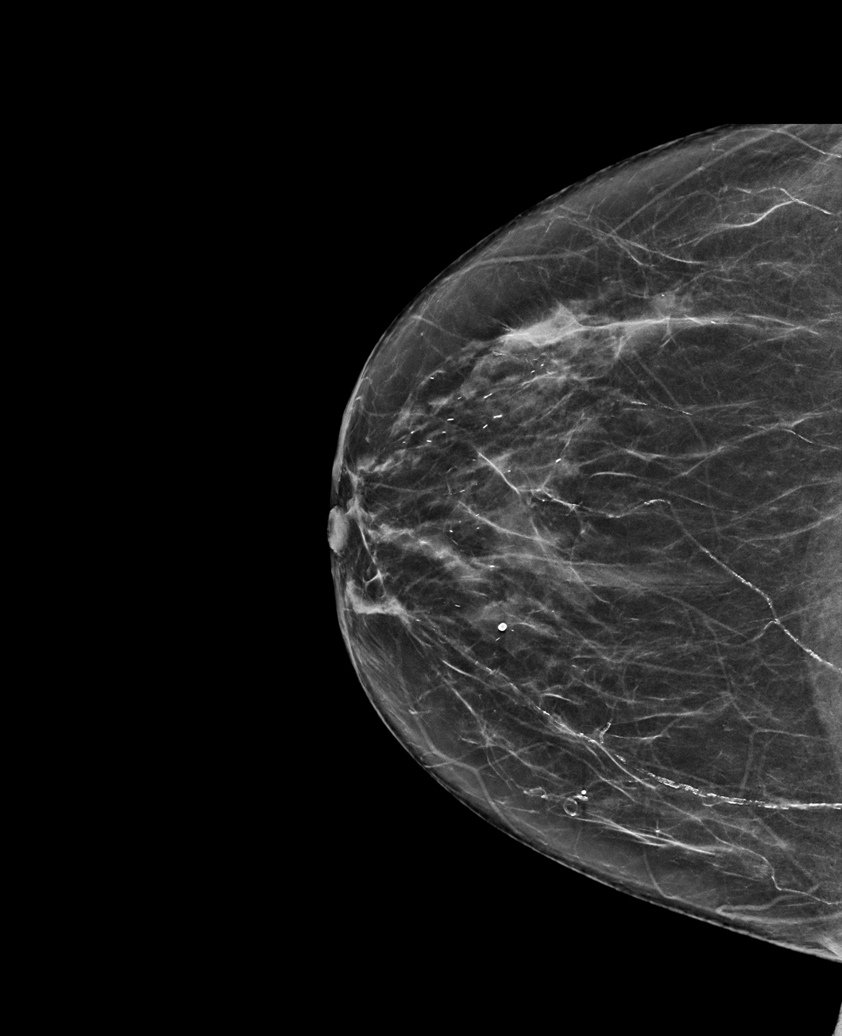

[L MLO synth-2D]
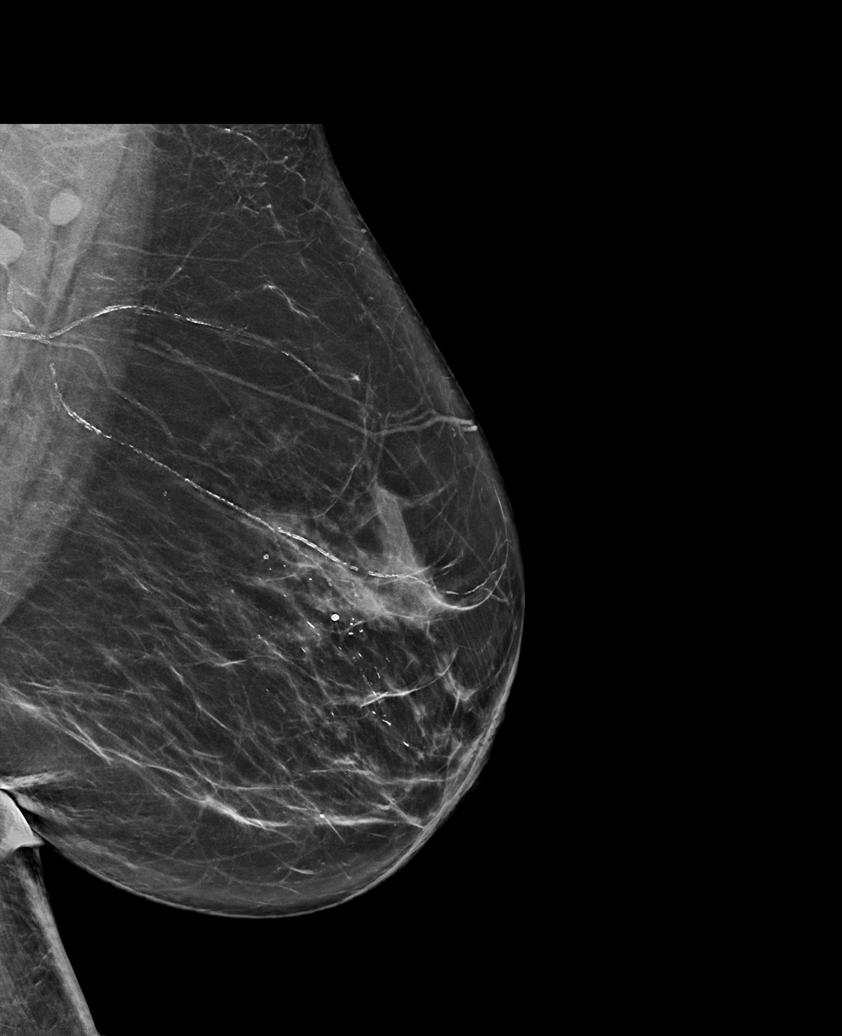

[L CC synth-2D]
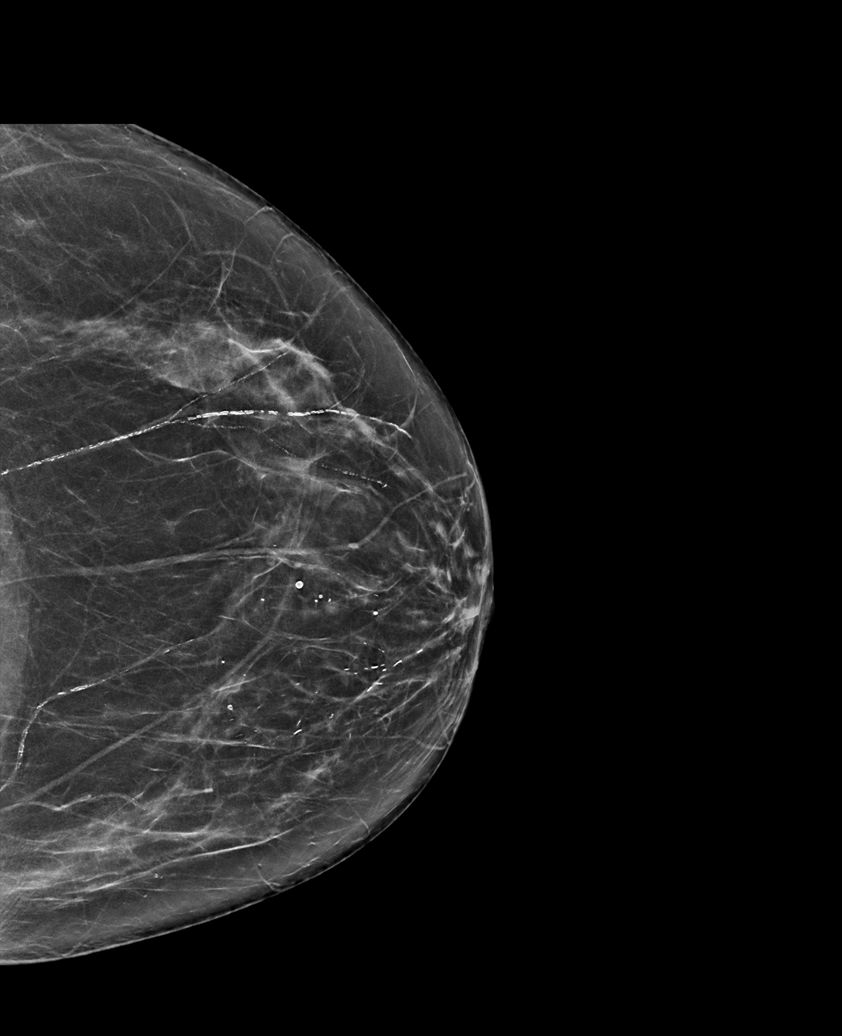

[R MLO synth-2D (1 of 2)]
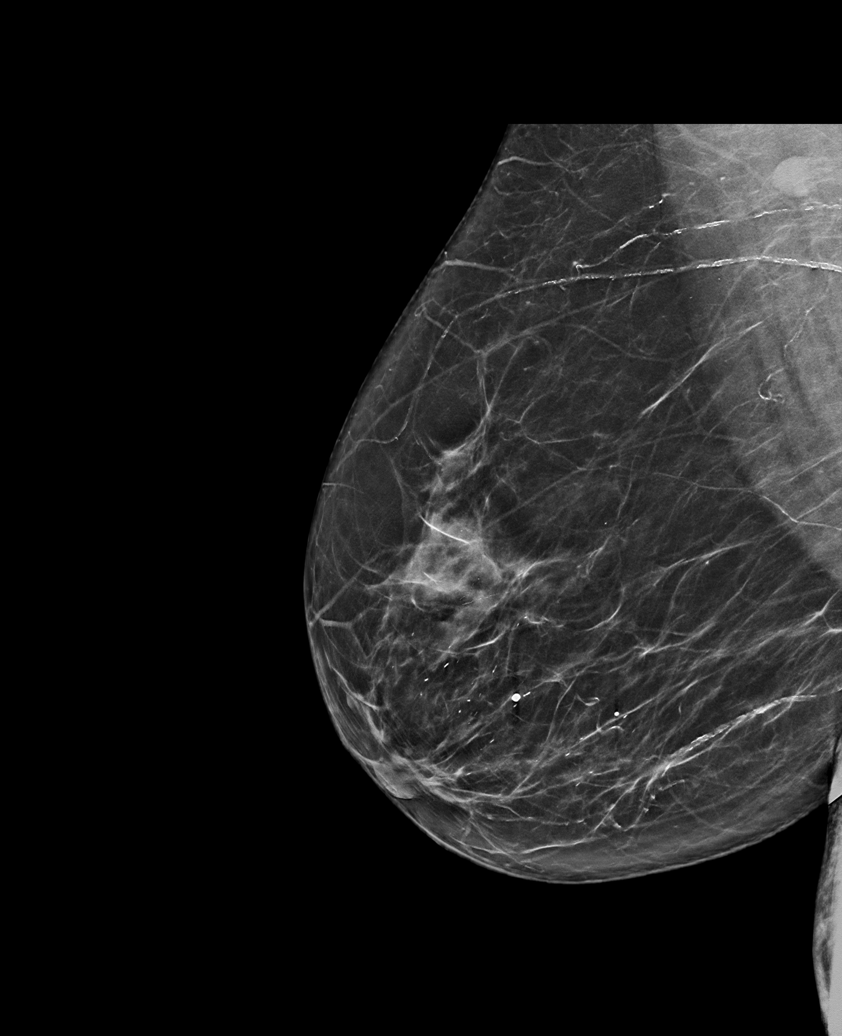

[R MLO synth-2D (2 of 2)]
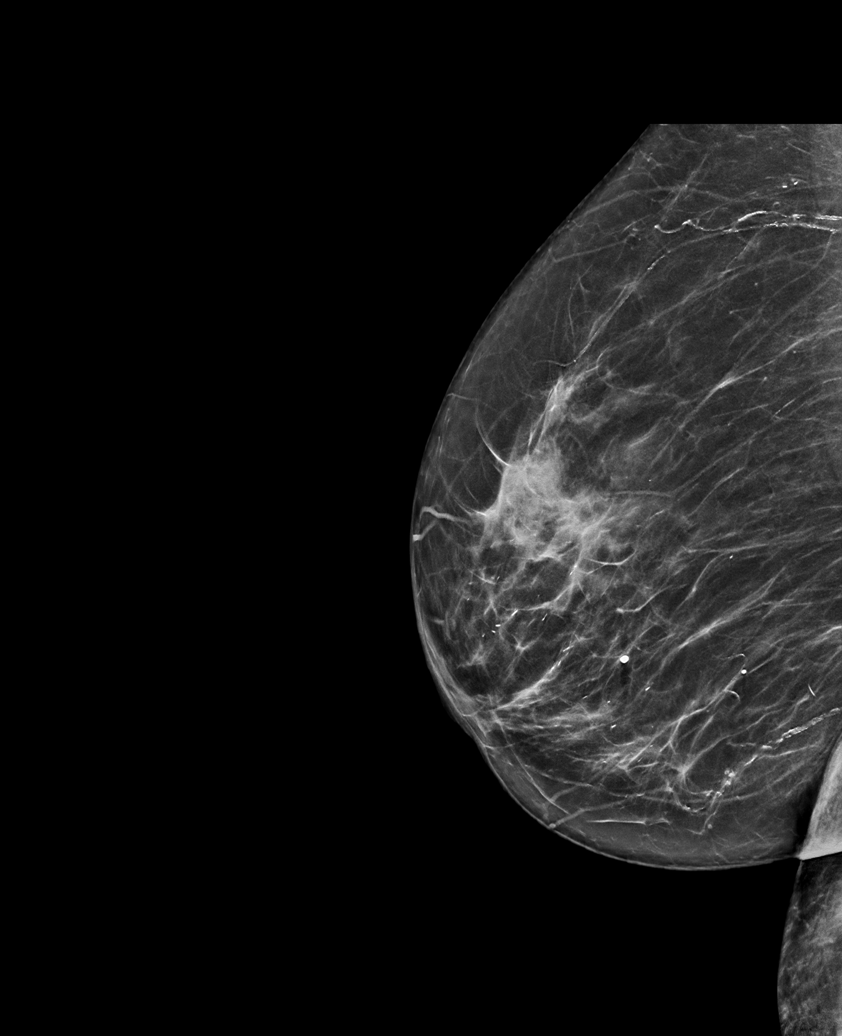

[R MLO tomo · tomo slice 38/75.0]
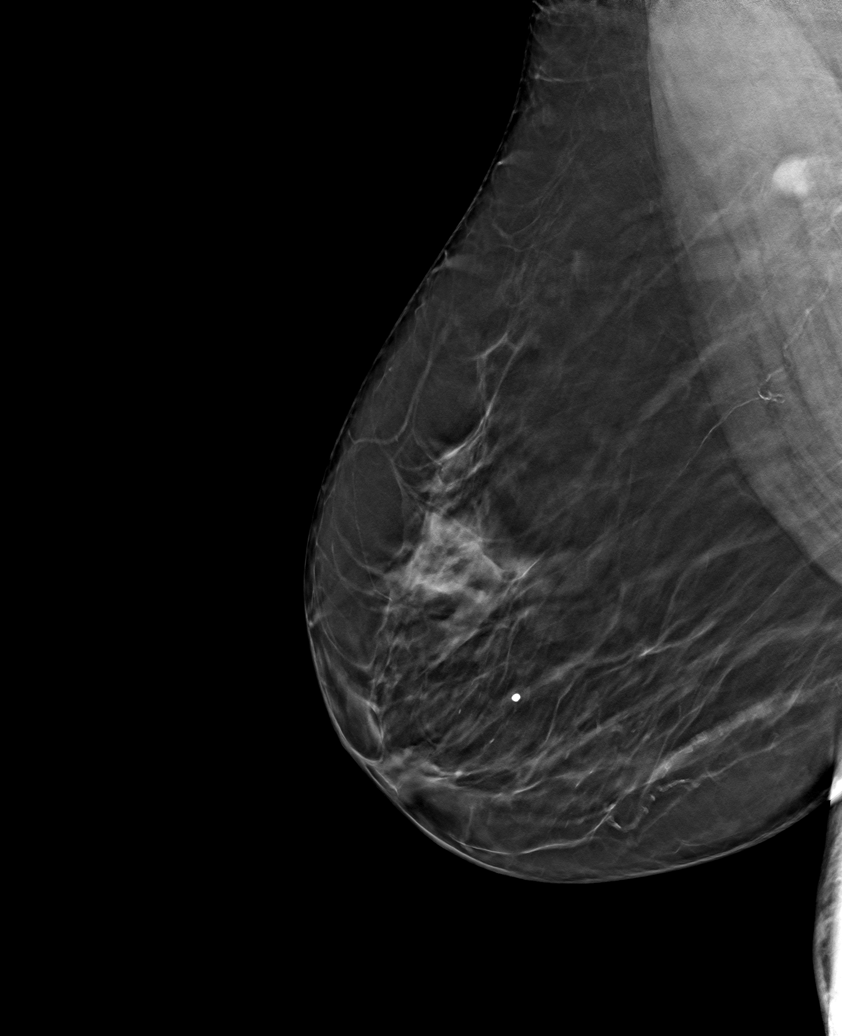

[6 of 30 positions shown; findings below may reference images not displayed]

ACR Breast Density Category b: There are scattered areas of
fibroglandular density.
FINDINGS: There are no findings suspicious for malignancy.
IMPRESSION: No mammographic evidence of malignancy. A result letter of this
screening mammogram will be mailed directly to the patient.

RECOMMENDATION:
Screening mammogram in one year. (Code:51-O-LD2)

BI-RADS CATEGORY  1: Negative.

## 2024-04-12 LAB — GLUCOSE, POCT (MANUAL RESULT ENTRY): POC Glucose: 138 mg/dL — AB (ref 70–99)

## 2024-04-12 NOTE — Congregational Nurse Program (Signed)
  Dept: [phone]   Congregational Nurse Program Note  Date of Encounter: 04/12/2024  Clinic visit to check blood pressure,blood glucose and weight.  BP 128/76, O2 Sat 99%.  Blood glucose 138 PC breakfast, discussed types of carbohydrates and effect on blood glucose levels. Past Medical History: Past Medical History:  Diagnosis Date   Diabetes mellitus    Gout    Hyperlipidemia    Hypertension     Encounter Details:  Community Questionnaire - 04/12/24 1040       Questionnaire   Ask client: Do you give verbal consent for me to treat you today? Yes    Student Assistance N/A    Location Patient Served  [name]    Encounter Setting CN site    Population Status Unknown   Has own apartment at [employer]    Insurance/Financial Assistance Referral N/A    Medication Patient Medications Reviewed    Medical Provider Yes    Screening Referrals Made N/A    Medical Referrals Made N/A    Medical Appointment Completed N/A    CNP Interventions Advocate/Support;Counsel;Educate;Spiritual Care    Screenings CN Performed Blood Pressure;Blood Glucose;Weight    ED Visit Averted N/A    Life-Saving Intervention Made N/A

## 2024-04-13 NOTE — Congregational Nurse Program (Signed)
  Dept: [phone]   Congregational Nurse Program Note  Date of Encounter: 03/29/2024  Resident given educational material per her request on diabetes prevention and normal blood pressure levels. Past Medical History: Past Medical History:  Diagnosis Date   Diabetes mellitus    Gout    Hyperlipidemia    Hypertension     Encounter Details:  Community Questionnaire - 03/29/24 1118       Questionnaire   Ask client: Do you give verbal consent for me to treat you today? Yes    Student Assistance N/A    Location Patient Served  [name]    Encounter Setting CN site    Population Status Unknown   Has own apartment at [employer]    Insurance/Financial Assistance Referral N/A    Medication Patient Medications Reviewed    Medical Provider Yes    Screening Referrals Made N/A    Medical Referrals Made N/A    Medical Appointment Completed N/A    CNP Interventions Advocate/Support;Counsel;Educate    Screenings CN Performed N/A    ED Visit Averted N/A    Life-Saving Intervention Made N/A

## 2024-04-19 LAB — GLUCOSE, POCT (MANUAL RESULT ENTRY): POC Glucose: 137 mg/dL — AB (ref 70–99)

## 2024-04-19 NOTE — Congregational Nurse Program (Signed)
  Dept: [phone]   Congregational Nurse Program Note  Date of Encounter: 04/19/2024  Clinic visit to check weight, blood glucose and blood pressure.  Blood glucose 137 PC breakfast, discussed types of carbohydrates and the amount of sugar in soft drinks and alternative to them. Past Medical History: Past Medical History:  Diagnosis Date   Diabetes mellitus    Gout    Hyperlipidemia    Hypertension     Encounter Details:  Community Questionnaire - 04/19/24 1035       Questionnaire   Ask client: Do you give verbal consent for me to treat you today? Yes    Student Assistance N/A    Location Patient Served  [name]    Encounter Setting CN site    Population Status Unknown   Has own apartment at [employer]    Insurance/Financial Assistance Referral N/A    Medication Patient Medications Reviewed    Medical Provider Yes    Screening Referrals Made N/A    Medical Referrals Made N/A    Medical Appointment Completed N/A    CNP Interventions Advocate/Support;Counsel;Educate    Screenings CN Performed Blood Pressure;Blood Glucose;Weight    ED Visit Averted N/A    Life-Saving Intervention Made N/A

## 2024-07-04 DIAGNOSIS — N184 Chronic kidney disease, stage 4 (severe): Secondary | ICD-10-CM | POA: Diagnosis not present

## 2024-07-04 DIAGNOSIS — E7800 Pure hypercholesterolemia, unspecified: Secondary | ICD-10-CM | POA: Diagnosis not present

## 2024-07-04 DIAGNOSIS — E1122 Type 2 diabetes mellitus with diabetic chronic kidney disease: Secondary | ICD-10-CM | POA: Diagnosis not present

## 2024-07-11 DIAGNOSIS — E1122 Type 2 diabetes mellitus with diabetic chronic kidney disease: Secondary | ICD-10-CM | POA: Diagnosis not present

## 2024-07-11 DIAGNOSIS — N189 Chronic kidney disease, unspecified: Secondary | ICD-10-CM | POA: Diagnosis not present

## 2024-07-11 DIAGNOSIS — I129 Hypertensive chronic kidney disease with stage 1 through stage 4 chronic kidney disease, or unspecified chronic kidney disease: Secondary | ICD-10-CM | POA: Diagnosis not present

## 2024-07-11 DIAGNOSIS — Z23 Encounter for immunization: Secondary | ICD-10-CM | POA: Diagnosis not present

## 2024-07-11 DIAGNOSIS — E785 Hyperlipidemia, unspecified: Secondary | ICD-10-CM | POA: Diagnosis not present

## 2024-07-11 DIAGNOSIS — Z79899 Other long term (current) drug therapy: Secondary | ICD-10-CM | POA: Diagnosis not present

## 2024-10-18 ENCOUNTER — Ambulatory Visit: Admit: 2024-10-18 | Admitting: Ophthalmology

## 2024-11-01 ENCOUNTER — Ambulatory Visit: Admit: 2024-11-01 | Admitting: Ophthalmology
